# Patient Record
Sex: Female | Born: 1945 | Race: White | Hispanic: No | Marital: Single | Smoking: Current every day smoker
Health system: Southern US, Community
[De-identification: ages and names within clinical notes are randomized; demographics above are authoritative.]

## PROBLEM LIST (undated history)

## (undated) DIAGNOSIS — IMO0002 Reserved for concepts with insufficient information to code with codable children: Secondary | ICD-10-CM

## (undated) DIAGNOSIS — N179 Acute kidney failure, unspecified: Secondary | ICD-10-CM

## (undated) DIAGNOSIS — E872 Acidosis, unspecified: Secondary | ICD-10-CM

## (undated) DIAGNOSIS — E860 Dehydration: Secondary | ICD-10-CM

## (undated) DIAGNOSIS — R51 Headache: Secondary | ICD-10-CM

## (undated) DIAGNOSIS — M79673 Pain in unspecified foot: Secondary | ICD-10-CM

## (undated) DIAGNOSIS — M1990 Unspecified osteoarthritis, unspecified site: Secondary | ICD-10-CM

## (undated) DIAGNOSIS — K759 Inflammatory liver disease, unspecified: Secondary | ICD-10-CM

## (undated) DIAGNOSIS — J449 Chronic obstructive pulmonary disease, unspecified: Secondary | ICD-10-CM

## (undated) HISTORY — DX: Unspecified osteoarthritis, unspecified site: M19.90

## (undated) HISTORY — DX: Pain in unspecified foot: M79.673

## (undated) HISTORY — PX: COLON SURGERY: SHX602

## (undated) HISTORY — DX: Inflammatory liver disease, unspecified: K75.9

## (undated) HISTORY — DX: Reserved for concepts with insufficient information to code with codable children: IMO0002

## (undated) HISTORY — DX: Headache: R51

---

## 1969-07-24 HISTORY — PX: CHOLECYSTECTOMY: SHX55

## 1997-07-24 HISTORY — PX: PARTIAL GASTRECTOMY: SHX2172

## 1997-07-24 HISTORY — PX: PANCREAS SURGERY: SHX731

## 1998-02-15 ENCOUNTER — Other Ambulatory Visit: Admission: RE | Admit: 1998-02-15 | Discharge: 1998-02-15 | Payer: Self-pay

## 2011-04-26 ENCOUNTER — Other Ambulatory Visit: Payer: Self-pay

## 2011-04-26 DIAGNOSIS — I739 Peripheral vascular disease, unspecified: Secondary | ICD-10-CM

## 2011-05-30 ENCOUNTER — Encounter: Payer: Self-pay | Admitting: Vascular Surgery

## 2011-06-06 ENCOUNTER — Encounter: Payer: Self-pay | Admitting: Vascular Surgery

## 2011-06-07 ENCOUNTER — Ambulatory Visit (INDEPENDENT_AMBULATORY_CARE_PROVIDER_SITE_OTHER): Payer: Medicare Other | Admitting: Vascular Surgery

## 2011-06-07 ENCOUNTER — Other Ambulatory Visit (INDEPENDENT_AMBULATORY_CARE_PROVIDER_SITE_OTHER): Payer: Medicare Other | Admitting: *Deleted

## 2011-06-07 ENCOUNTER — Encounter: Payer: Self-pay | Admitting: Vascular Surgery

## 2011-06-07 VITALS — BP 99/63 | HR 67 | Resp 18 | Ht 60.0 in | Wt 99.0 lb

## 2011-06-07 DIAGNOSIS — R0989 Other specified symptoms and signs involving the circulatory and respiratory systems: Secondary | ICD-10-CM

## 2011-06-07 DIAGNOSIS — I70219 Atherosclerosis of native arteries of extremities with intermittent claudication, unspecified extremity: Secondary | ICD-10-CM

## 2011-06-07 DIAGNOSIS — Z48812 Encounter for surgical aftercare following surgery on the circulatory system: Secondary | ICD-10-CM

## 2011-06-07 NOTE — Progress Notes (Signed)
[facility] [place]  [name] MRN: [id number] DOB: 1946/02/05 Sex: female  CC: referred by Dr.Petery to evaluate circulation prior to surgery on toes.  HPI: Vanessa Massey is a 65 y.o. female was referred for evaluation of her circulatory status of both lower extremities. He has a plantar fasciitis is also problems with her toenails. She may require some upcoming surgery on her toes. She denies any history of claudication, rest pain, or nonhealing ulcers. She does admit to night cramps in her calves at times.  Past Medical History  Diagnosis Date  . Heel pain   . Hepatitis   . Arthritis   . Asthma   . Ulcer   . Headache     Family History  Problem Relation Age of Onset  . Diabetes Mother   . Arthritis Father   . Cancer Father     SOCIAL HISTORY: History  Substance Use Topics  . Smoking status: Current Everyday Smoker -- 0.1 packs/day    Types: Cigarettes  . Smokeless tobacco: Not on file   Comment: using Nicoderm patches  . Alcohol Use: No    Allergies  Allergen Reactions  . Morphine And Related Nausea And Vomiting    Current Outpatient Prescriptions  Medication Sig Dispense Refill  . alendronate (FOSAMAX) 70 MG tablet Take 70 mg by mouth every 7 (seven) days. Take with a full glass of water on an empty stomach.       . esomeprazole (NEXIUM) 40 MG capsule Take 40 mg by mouth daily before breakfast.        . Fluticasone-Salmeterol (ADVAIR) 250-50 MCG/DOSE AEPB Inhale 1 puff into the lungs every 12 (twelve) hours.        . meloxicam (MOBIC) 15 MG tablet Take 15 mg by mouth daily.        . mirtazapine (REMERON) 15 MG tablet Take 15 mg by mouth at bedtime.        Marland Kitchen oxycodone (OXY-IR) 5 MG capsule Take 5 mg by mouth every 4 (four) hours as needed.        . traZODone (DESYREL) 100 MG tablet Take 100 mg by mouth at bedtime.          REVIEW OF SYSTEMS: Arly.Keller ] denotes positive finding; [  ] denotes negative finding CARDIOVASCULAR:   [ ]  chest pain   [ ]  chest pressure   [ ]  palpitations   [ ]  orthopnea   [ ]  dyspnea on exertion   [ ]  claudication   [ ]  rest pain   [ ]  DVT   [ ]  phlebitis PULMONARY:   [ ]  productive cough   [ ]  asthma   [ ]  wheezing NEUROLOGIC:   [ ]  weakness  [ ]  paresthesias  [ ]  aphasia  [ ]  amaurosis  [ ]  dizziness HEMATOLOGIC:   [ ]  bleeding problems   [ ]  clotting disorders MUSCULOSKELETAL:  [ ]  joint pain   [ ]  joint swelling [ ]  leg swelling GASTROINTESTINAL: [ ]   blood in stool  [ ]   hematemesis GENITOURINARY:  [ ]   dysuria  [ ]   hematuria PSYCHIATRIC:  [ ]  history of major depression INTEGUMENTARY:  [ ]  rashes  [ ]  ulcers CONSTITUTIONAL:  [ ]  fever   [ ]  chills  PHYSICAL EXAM: Filed Vitals:   06/07/11 1514  BP: 99/63  Pulse: 67  Resp: 18  Height: 5' (1.524 m)  Weight: 99 lb (44.906 kg)  SpO2: 96%  Body mass index is 19.33 kg/(m^2). GENERAL: The patient is a well-nourished female, in no acute distress. The vital signs are documented above. CARDIOVASCULAR: There is a regular rate and rhythm without significant murmur appreciated. Do not detect any carotid bruits. She has palpable femoral and dorsalis pedis pulses bilaterally. She had noticed a palpable left posterior tibial pulse. Both feet are warm and well perfused. She has no significant lower Cherie swelling. PULMONARY: There is good air exchange bilaterally without wheezing or rales. ABDOMEN: Soft and non-tender with normal pitched bowel sounds.  MUSCULOSKELETAL: There are no major deformities or cyanosis. NEUROLOGIC: No focal weakness or paresthesias are detected. SKIN: There are no ulcers or rashes noted. PSYCHIATRIC: The patient has a normal affect.  DATA:  I have independently interpreted her arterial Doppler study which shows an ABI of 83% on the right and 95% on the left. She has a biphasic right posterior tibial and dorsalis pedis signal. She has a biphasic left posterior tibial signal and a monophasic left dorsalis pedis  signal. Pressure of the right is 85 mmHg. Pressure on the left is 80 mmHg.  MEDICAL ISSUES: Based on her exam and Doppler studies I think she has adequate circulation in both feet to heal any surgery that might be required on her toes. Normally toe pressure of greater than or equal to 40 mm of mercury is adequate for healing. She does have a history of smoking and seems very interested in tobacco cessation. I have sent a referral to the [facility] tobacco cessation clinic who will be in contact with her. We'll see her back as needed.  Damary Doland S [facility] [place] Office: [phone]

## 2017-01-03 ENCOUNTER — Encounter (HOSPITAL_COMMUNITY): Payer: Self-pay | Admitting: Emergency Medicine

## 2017-01-03 ENCOUNTER — Emergency Department (HOSPITAL_COMMUNITY): Payer: Medicare Other

## 2017-01-03 ENCOUNTER — Inpatient Hospital Stay (HOSPITAL_COMMUNITY)
Admission: EM | Admit: 2017-01-03 | Discharge: 2017-01-11 | DRG: 871 | Disposition: A | Discharge status: Home / Self Care | Payer: Medicare Other | Attending: Internal Medicine | Admitting: Internal Medicine

## 2017-01-03 DIAGNOSIS — A419 Sepsis, unspecified organism: Secondary | ICD-10-CM | POA: Diagnosis present

## 2017-01-03 DIAGNOSIS — Z7189 Other specified counseling: Secondary | ICD-10-CM | POA: Diagnosis not present

## 2017-01-03 DIAGNOSIS — R195 Other fecal abnormalities: Secondary | ICD-10-CM | POA: Diagnosis not present

## 2017-01-03 DIAGNOSIS — J9601 Acute respiratory failure with hypoxia: Secondary | ICD-10-CM | POA: Diagnosis present

## 2017-01-03 DIAGNOSIS — G8929 Other chronic pain: Secondary | ICD-10-CM | POA: Diagnosis present

## 2017-01-03 DIAGNOSIS — W1830XA Fall on same level, unspecified, initial encounter: Secondary | ICD-10-CM | POA: Diagnosis present

## 2017-01-03 DIAGNOSIS — E872 Acidosis, unspecified: Secondary | ICD-10-CM

## 2017-01-03 DIAGNOSIS — S32591A Other specified fracture of right pubis, initial encounter for closed fracture: Secondary | ICD-10-CM | POA: Diagnosis present

## 2017-01-03 DIAGNOSIS — W19XXXA Unspecified fall, initial encounter: Secondary | ICD-10-CM

## 2017-01-03 DIAGNOSIS — S329XXD Fracture of unspecified parts of lumbosacral spine and pelvis, subsequent encounter for fracture with routine healing: Secondary | ICD-10-CM | POA: Diagnosis not present

## 2017-01-03 DIAGNOSIS — Z79899 Other long term (current) drug therapy: Secondary | ICD-10-CM

## 2017-01-03 DIAGNOSIS — J9600 Acute respiratory failure, unspecified whether with hypoxia or hypercapnia: Secondary | ICD-10-CM | POA: Diagnosis present

## 2017-01-03 DIAGNOSIS — D72829 Elevated white blood cell count, unspecified: Secondary | ICD-10-CM | POA: Diagnosis present

## 2017-01-03 DIAGNOSIS — E875 Hyperkalemia: Secondary | ICD-10-CM

## 2017-01-03 DIAGNOSIS — S3210XA Unspecified fracture of sacrum, initial encounter for closed fracture: Secondary | ICD-10-CM | POA: Diagnosis present

## 2017-01-03 DIAGNOSIS — Z1612 Extended spectrum beta lactamase (ESBL) resistance: Secondary | ICD-10-CM | POA: Diagnosis not present

## 2017-01-03 DIAGNOSIS — E876 Hypokalemia: Secondary | ICD-10-CM | POA: Diagnosis present

## 2017-01-03 DIAGNOSIS — I480 Paroxysmal atrial fibrillation: Secondary | ICD-10-CM | POA: Diagnosis present

## 2017-01-03 DIAGNOSIS — D509 Iron deficiency anemia, unspecified: Secondary | ICD-10-CM | POA: Diagnosis present

## 2017-01-03 DIAGNOSIS — M25552 Pain in left hip: Secondary | ICD-10-CM

## 2017-01-03 DIAGNOSIS — S32501D Unspecified fracture of right pubis, subsequent encounter for fracture with routine healing: Secondary | ICD-10-CM | POA: Diagnosis not present

## 2017-01-03 DIAGNOSIS — K861 Other chronic pancreatitis: Secondary | ICD-10-CM | POA: Diagnosis present

## 2017-01-03 DIAGNOSIS — I482 Chronic atrial fibrillation: Secondary | ICD-10-CM | POA: Diagnosis present

## 2017-01-03 DIAGNOSIS — A4151 Sepsis due to Escherichia coli [E. coli]: Secondary | ICD-10-CM | POA: Diagnosis present

## 2017-01-03 DIAGNOSIS — K759 Inflammatory liver disease, unspecified: Secondary | ICD-10-CM

## 2017-01-03 DIAGNOSIS — I70219 Atherosclerosis of native arteries of extremities with intermittent claudication, unspecified extremity: Secondary | ICD-10-CM | POA: Diagnosis present

## 2017-01-03 DIAGNOSIS — J449 Chronic obstructive pulmonary disease, unspecified: Secondary | ICD-10-CM | POA: Diagnosis present

## 2017-01-03 DIAGNOSIS — E860 Dehydration: Secondary | ICD-10-CM

## 2017-01-03 DIAGNOSIS — Z66 Do not resuscitate: Secondary | ICD-10-CM | POA: Diagnosis present

## 2017-01-03 DIAGNOSIS — B9629 Other Escherichia coli [E. coli] as the cause of diseases classified elsewhere: Secondary | ICD-10-CM | POA: Diagnosis not present

## 2017-01-03 DIAGNOSIS — J42 Unspecified chronic bronchitis: Secondary | ICD-10-CM | POA: Diagnosis not present

## 2017-01-03 DIAGNOSIS — F329 Major depressive disorder, single episode, unspecified: Secondary | ICD-10-CM | POA: Diagnosis present

## 2017-01-03 DIAGNOSIS — R627 Adult failure to thrive: Secondary | ICD-10-CM | POA: Diagnosis not present

## 2017-01-03 DIAGNOSIS — N179 Acute kidney failure, unspecified: Secondary | ICD-10-CM

## 2017-01-03 DIAGNOSIS — S329XXA Fracture of unspecified parts of lumbosacral spine and pelvis, initial encounter for closed fracture: Secondary | ICD-10-CM

## 2017-01-03 DIAGNOSIS — E162 Hypoglycemia, unspecified: Secondary | ICD-10-CM | POA: Diagnosis present

## 2017-01-03 DIAGNOSIS — Z515 Encounter for palliative care: Secondary | ICD-10-CM

## 2017-01-03 DIAGNOSIS — D649 Anemia, unspecified: Secondary | ICD-10-CM | POA: Diagnosis present

## 2017-01-03 DIAGNOSIS — I4891 Unspecified atrial fibrillation: Secondary | ICD-10-CM | POA: Diagnosis not present

## 2017-01-03 DIAGNOSIS — N10 Acute tubulo-interstitial nephritis: Secondary | ICD-10-CM | POA: Diagnosis present

## 2017-01-03 DIAGNOSIS — E43 Unspecified severe protein-calorie malnutrition: Secondary | ICD-10-CM | POA: Diagnosis present

## 2017-01-03 DIAGNOSIS — N390 Urinary tract infection, site not specified: Secondary | ICD-10-CM | POA: Diagnosis not present

## 2017-01-03 DIAGNOSIS — W19XXXS Unspecified fall, sequela: Secondary | ICD-10-CM | POA: Diagnosis not present

## 2017-01-03 DIAGNOSIS — E871 Hypo-osmolality and hyponatremia: Secondary | ICD-10-CM | POA: Diagnosis present

## 2017-01-03 DIAGNOSIS — S3210XD Unspecified fracture of sacrum, subsequent encounter for fracture with routine healing: Secondary | ICD-10-CM | POA: Diagnosis not present

## 2017-01-03 HISTORY — DX: Acidosis, unspecified: E87.20

## 2017-01-03 HISTORY — DX: Dehydration: E86.0

## 2017-01-03 HISTORY — DX: Chronic obstructive pulmonary disease, unspecified: J44.9

## 2017-01-03 HISTORY — DX: Acidosis: E87.2

## 2017-01-03 HISTORY — DX: Acute kidney failure, unspecified: N17.9

## 2017-01-03 LAB — BASIC METABOLIC PANEL
Anion gap: 22 — ABNORMAL HIGH (ref 5–15)
Anion gap: 16 — ABNORMAL HIGH (ref 5–15)
BUN: 47 mg/dL — AB (ref 6–20)
BUN: 45 mg/dL — AB (ref 6–20)
CALCIUM: 7.6 mg/dL — AB (ref 8.9–10.3)
CHLORIDE: 98 mmol/L — AB (ref 101–111)
CO2: 10 mmol/L — ABNORMAL LOW (ref 22–32)
CO2: 12 mmol/L — ABNORMAL LOW (ref 22–32)
CREATININE: 2.14 mg/dL — AB (ref 0.44–1.00)
CREATININE: 1.92 mg/dL — AB (ref 0.44–1.00)
Calcium: 6.8 mg/dL — ABNORMAL LOW (ref 8.9–10.3)
Chloride: 101 mmol/L (ref 101–111)
GFR calc Af Amer: 26 mL/min — ABNORMAL LOW (ref >60)
GFR calc Af Amer: 30 mL/min — ABNORMAL LOW (ref >60)
GFR calc non Af Amer: 22 mL/min — ABNORMAL LOW (ref >60)
GFR, EST NON AFRICAN AMERICAN: 26 mL/min — AB (ref >60)
GLUCOSE: 121 mg/dL — AB (ref 65–99)
Glucose, Bld: <20 mg/dL — CL (ref 65–99)
Potassium: 6.2 mmol/L — ABNORMAL HIGH (ref 3.5–5.1)
Potassium: 5.6 mmol/L — ABNORMAL HIGH (ref 3.5–5.1)
SODIUM: 130 mmol/L — AB (ref 135–145)
SODIUM: 129 mmol/L — AB (ref 135–145)

## 2017-01-03 LAB — I-STAT CHEM 8, ED
BUN: 47 mg/dL — ABNORMAL HIGH (ref 6–20)
CALCIUM ION: 0.88 mmol/L — AB (ref 1.15–1.40)
CHLORIDE: 102 mmol/L (ref 101–111)
Creatinine, Ser: 2.1 mg/dL — ABNORMAL HIGH (ref 0.44–1.00)
GLUCOSE: 35 mg/dL — AB (ref 65–99)
HCT: 29 % — ABNORMAL LOW (ref 36.0–46.0)
Hemoglobin: 9.9 g/dL — ABNORMAL LOW (ref 12.0–15.0)
Potassium: 5.9 mmol/L — ABNORMAL HIGH (ref 3.5–5.1)
SODIUM: 127 mmol/L — AB (ref 135–145)
TCO2: 13 mmol/L (ref 0–100)

## 2017-01-03 LAB — LACTIC ACID, PLASMA: Lactic Acid, Venous: 1.7 mmol/L (ref 0.5–1.9)

## 2017-01-03 LAB — IRON AND TIBC
Iron: 26 ug/dL — ABNORMAL LOW (ref 28–170)
Saturation Ratios: 11 % (ref 10.4–31.8)
TIBC: 246 ug/dL — ABNORMAL LOW (ref 250–450)
UIBC: 220 ug/dL

## 2017-01-03 LAB — RETICULOCYTES
RBC.: 2.87 MIL/uL — ABNORMAL LOW (ref 3.87–5.11)
RETIC COUNT ABSOLUTE: 74.6 10*3/uL (ref 19.0–186.0)
RETIC CT PCT: 2.6 % (ref 0.4–3.1)

## 2017-01-03 LAB — PROTIME-INR
INR: 1.74
Prothrombin Time: 20.5 seconds — ABNORMAL HIGH (ref 11.4–15.2)

## 2017-01-03 LAB — CBC WITH DIFFERENTIAL/PLATELET
Basophils Absolute: 0 10*3/uL (ref 0.0–0.1)
Basophils Relative: 0 %
EOS ABS: 0 10*3/uL (ref 0.0–0.7)
Eosinophils Relative: 0 %
HCT: 26.2 % — ABNORMAL LOW (ref 36.0–46.0)
Hemoglobin: 8.3 g/dL — ABNORMAL LOW (ref 12.0–15.0)
Lymphocytes Relative: 5 %
Lymphs Abs: 1.8 10*3/uL (ref 0.7–4.0)
MCH: 23.8 pg — ABNORMAL LOW (ref 26.0–34.0)
MCHC: 31.7 g/dL (ref 30.0–36.0)
MCV: 75.1 fL — ABNORMAL LOW (ref 78.0–100.0)
MONO ABS: 1.5 10*3/uL — AB (ref 0.1–1.0)
Monocytes Relative: 4 %
NEUTROS PCT: 91 %
Neutro Abs: 33.6 10*3/uL — ABNORMAL HIGH (ref 1.7–7.7)
PLATELETS: 456 10*3/uL — AB (ref 150–400)
RBC: 3.49 MIL/uL — ABNORMAL LOW (ref 3.87–5.11)
RDW: 16.9 % — AB (ref 11.5–15.5)
WBC: 36.9 10*3/uL — AB (ref 4.0–10.5)

## 2017-01-03 LAB — CBG MONITORING, ED
Glucose-Capillary: 34 mg/dL — CL (ref 65–99)
Glucose-Capillary: 139 mg/dL — ABNORMAL HIGH (ref 65–99)

## 2017-01-03 LAB — URINALYSIS, ROUTINE W REFLEX MICROSCOPIC
BILIRUBIN URINE: NEGATIVE
Glucose, UA: NEGATIVE mg/dL
KETONES UR: 5 mg/dL — AB
Nitrite: NEGATIVE
Protein, ur: 30 mg/dL — AB
SPECIFIC GRAVITY, URINE: 1.017 (ref 1.005–1.030)
pH: 5 (ref 5.0–8.0)

## 2017-01-03 LAB — PROCALCITONIN: PROCALCITONIN: 1.26 ng/mL

## 2017-01-03 LAB — GLUCOSE, CAPILLARY: Glucose-Capillary: 112 mg/dL — ABNORMAL HIGH (ref 65–99)

## 2017-01-03 LAB — APTT: APTT: 36 s (ref 24–36)

## 2017-01-03 LAB — FERRITIN: FERRITIN: 125 ng/mL (ref 11–307)

## 2017-01-03 LAB — OSMOLALITY: Osmolality: 292 mOsm/kg (ref 275–295)

## 2017-01-03 LAB — VITAMIN B12: Vitamin B-12: 1366 pg/mL — ABNORMAL HIGH (ref 180–914)

## 2017-01-03 LAB — I-STAT CG4 LACTIC ACID, ED: Lactic Acid, Venous: 2.23 mmol/L (ref 0.5–1.9)

## 2017-01-03 LAB — CK: CK TOTAL: 135 U/L (ref 38–234)

## 2017-01-03 LAB — FOLATE: Folate: 18.1 ng/mL (ref >5.9)

## 2017-01-03 MED ORDER — VENLAFAXINE HCL ER 37.5 MG PO CP24
37.5000 mg | ORAL_CAPSULE | Freq: Every day | ORAL | Status: DC
  Administered 2017-01-04: 37.5 mg via ORAL
  Filled 2017-01-03: qty 1

## 2017-01-03 MED ORDER — ONDANSETRON HCL 4 MG/2ML IJ SOLN
4.0000 mg | Freq: Once | INTRAMUSCULAR | Status: AC
Start: 2017-01-03 — End: 2017-01-03
  Administered 2017-01-03: 4 mg via INTRAVENOUS
  Filled 2017-01-03: qty 2

## 2017-01-03 MED ORDER — HEPARIN SODIUM (PORCINE) 5000 UNIT/ML IJ SOLN
5000.0000 [IU] | Freq: Three times a day (TID) | INTRAMUSCULAR | Status: DC
  Administered 2017-01-03 – 2017-01-04 (×3): 5000 [IU] via SUBCUTANEOUS
  Filled 2017-01-03 (×3): qty 1

## 2017-01-03 MED ORDER — ALBUTEROL SULFATE (2.5 MG/3ML) 0.083% IN NEBU: 3.0000 mL | INHALATION_SOLUTION | Freq: Four times a day (QID) | RESPIRATORY_TRACT | Status: DC | PRN

## 2017-01-03 MED ORDER — ONDANSETRON HCL 4 MG PO TABS: 8.0000 mg | ORAL_TABLET | Freq: Three times a day (TID) | ORAL | Status: DC | PRN

## 2017-01-03 MED ORDER — SODIUM POLYSTYRENE SULFONATE 15 GM/60ML PO SUSP
15.0000 g | Freq: Once | ORAL | Status: AC
  Administered 2017-01-03: 15 g via ORAL
  Filled 2017-01-03: qty 60

## 2017-01-03 MED ORDER — ONDANSETRON HCL 4 MG/2ML IJ SOLN: 4.0000 mg | Freq: Four times a day (QID) | INTRAMUSCULAR | Status: DC | PRN

## 2017-01-03 MED ORDER — SODIUM CHLORIDE 0.9 % IV SOLN: 500.0000 mg | INTRAVENOUS | Status: DC

## 2017-01-03 MED ORDER — LINACLOTIDE 145 MCG PO CAPS
145.0000 ug | ORAL_CAPSULE | Freq: Every day | ORAL | Status: DC
  Administered 2017-01-04: 145 ug via ORAL
  Filled 2017-01-03: qty 1

## 2017-01-03 MED ORDER — MOMETASONE FURO-FORMOTEROL FUM 200-5 MCG/ACT IN AERO
2.0000 | INHALATION_SPRAY | Freq: Two times a day (BID) | RESPIRATORY_TRACT | Status: DC
  Filled 2017-01-03: qty 8.8

## 2017-01-03 MED ORDER — CEFTRIAXONE SODIUM 1 G IJ SOLR
1.0000 g | Freq: Once | INTRAMUSCULAR | Status: AC
  Administered 2017-01-03: 1 g via INTRAVENOUS
  Filled 2017-01-03: qty 10

## 2017-01-03 MED ORDER — DEXTROSE 50 % IV SOLN
1.0000 | Freq: Once | INTRAVENOUS | Status: AC
  Administered 2017-01-03: 50 mL via INTRAVENOUS
  Filled 2017-01-03: qty 50

## 2017-01-03 MED ORDER — SODIUM CHLORIDE 0.9% FLUSH: 10.0000 mL | INTRAVENOUS | Status: DC | PRN

## 2017-01-03 MED ORDER — OXYCODONE HCL 5 MG PO TABS
20.0000 mg | ORAL_TABLET | ORAL | Status: DC | PRN
  Administered 2017-01-03 – 2017-01-04 (×2): 20 mg via ORAL
  Filled 2017-01-03 (×4): qty 4

## 2017-01-03 MED ORDER — PANCRELIPASE (LIP-PROT-AMYL) 12000-38000 UNITS PO CPEP
24000.0000 [IU] | ORAL_CAPSULE | Freq: Three times a day (TID) | ORAL | Status: DC
  Administered 2017-01-03 – 2017-01-04 (×3): 24000 [IU] via ORAL
  Filled 2017-01-03 (×3): qty 2

## 2017-01-03 MED ORDER — ALBUTEROL SULFATE (2.5 MG/3ML) 0.083% IN NEBU
2.5000 mg | INHALATION_SOLUTION | Freq: Four times a day (QID) | RESPIRATORY_TRACT | Status: DC
  Filled 2017-01-03 (×2): qty 3

## 2017-01-03 MED ORDER — BOOST / RESOURCE BREEZE PO LIQD
1.0000 | Freq: Three times a day (TID) | ORAL | Status: DC
  Administered 2017-01-03 – 2017-01-04 (×2): 1 via ORAL

## 2017-01-03 MED ORDER — ACETAMINOPHEN 325 MG PO TABS
650.0000 mg | ORAL_TABLET | Freq: Once | ORAL | Status: AC
  Administered 2017-01-03: 650 mg via ORAL
  Filled 2017-01-03: qty 2

## 2017-01-03 MED ORDER — MIRTAZAPINE 15 MG PO TABS
15.0000 mg | ORAL_TABLET | Freq: Every day | ORAL | Status: DC
  Administered 2017-01-03: 15 mg via ORAL
  Filled 2017-01-03: qty 1

## 2017-01-03 MED ORDER — SODIUM CHLORIDE 0.9 % IV BOLUS (SEPSIS)
1000.0000 mL | Freq: Once | INTRAVENOUS | Status: AC
  Administered 2017-01-03: 1000 mL via INTRAVENOUS

## 2017-01-03 MED ORDER — PIPERACILLIN-TAZOBACTAM 3.375 G IVPB 30 MIN: 3.3750 g | Freq: Once | INTRAVENOUS | Status: DC

## 2017-01-03 MED ORDER — VANCOMYCIN HCL IN DEXTROSE 750-5 MG/150ML-% IV SOLN
750.0000 mg | Freq: Once | INTRAVENOUS | Status: DC
  Filled 2017-01-03: qty 150

## 2017-01-03 MED ORDER — ACETAMINOPHEN 650 MG RE SUPP: 650.0000 mg | Freq: Four times a day (QID) | RECTAL | Status: DC | PRN

## 2017-01-03 MED ORDER — ONDANSETRON HCL 4 MG PO TABS: 4.0000 mg | ORAL_TABLET | Freq: Four times a day (QID) | ORAL | Status: DC | PRN

## 2017-01-03 MED ORDER — DEXTROSE 5 % IV SOLN
1.0000 g | INTRAVENOUS | Status: DC
  Filled 2017-01-03: qty 10

## 2017-01-03 MED ORDER — ACETAMINOPHEN 325 MG PO TABS: 650.0000 mg | ORAL_TABLET | Freq: Four times a day (QID) | ORAL | Status: DC | PRN

## 2017-01-03 MED ORDER — PANTOPRAZOLE SODIUM 40 MG PO TBEC
40.0000 mg | DELAYED_RELEASE_TABLET | Freq: Every day | ORAL | Status: DC
  Administered 2017-01-04: 40 mg via ORAL
  Filled 2017-01-03: qty 1

## 2017-01-03 MED ORDER — VANCOMYCIN HCL IN DEXTROSE 1-5 GM/200ML-% IV SOLN: 1000.0000 mg | Freq: Once | INTRAVENOUS | Status: DC

## 2017-01-03 MED ORDER — SODIUM CHLORIDE 0.9 % IV SOLN
INTRAVENOUS | Status: DC
  Administered 2017-01-03 – 2017-01-04 (×3): via INTRAVENOUS

## 2017-01-03 NOTE — ED Notes (Signed)
[facility] and antibiotic. Informed that antibiotic had not been initiated d/t blood cultures have not been drawn. [facility]link reports that if patient is a difficult stick (as this patient is very difficult) that the antibiotic may be given prior to blood cultures drawn. Will initiate antibiotic promptly.

## 2017-01-03 NOTE — Progress Notes (Signed)
Pharmacy Antibiotic Note  Vanessa MakiJudy F Massey is a 71 y.o. female admitted on 01/03/2017 with sepsis.  Pharmacy has been consulted for vancomycin and zosyn dosing. WBC elevated at 37, afebrile and LA elevated at 2.23. SCr 2.1 for estimated CrCl ~ 10-15 mL/min. Low total body weight noted.   Plan: Vancomycin 750mg  IV x1, then 500 mg IV q48hr  Ceftriaxone 1g IV q24hr per MD  Vancomycin trough at SS and as needed (goal trough 15-20 mcg/mL) Monitor renal function, clinical picture, and culture data F/u length of therapy   Height: 4\' 11"  (149.9 cm) IBW/kg (Calculated) : 43.2  Temp (24hrs), Avg:97.9 F (36.6 C), Min:97.9 F (36.6 C), Max:97.9 F (36.6 C)   Recent Labs Lab 01/03/17 1053 01/03/17 1058 01/03/17 1300 01/03/17 1351  WBC  --   --  36.9*  --   CREATININE 2.14* 2.10*  --   --   LATICACIDVEN  --   --   --  2.23*    CrCl cannot be calculated (Unknown ideal weight.).    Allergies  Allergen Reactions  . Bee Pollen Shortness Of Breath  . Morphine And Related Nausea And Vomiting  . Varenicline Other (See Comments)    Nightmares, Nausea,depressive thoughts  . Codeine Nausea Only  . Neomycin Rash    Antimicrobials this admission: 6/13 Vanc >>  6/13 CTX>>  Dose adjustments this admission: n/a  Microbiology results: pending   York CeriseKatherine Cook, PharmD Pharmacy Resident  Pager [phone] 01/03/17 3:28 PM

## 2017-01-03 NOTE — Progress Notes (Signed)
Notified Schorr, NP pt refusing for blood cultures to be drawn and explained to the pt the need to get blood cultures pt continued to refused. Will continue to monitor pt. Nelda MarseilleJenny Thacker, RN

## 2017-01-03 NOTE — ED Notes (Signed)
Patient reported that she needed to urinate. Patient placed on bedpan. Patient unable to urinate in bedpan. Will in and out cath patient for urine specimen.

## 2017-01-03 NOTE — ED Provider Notes (Signed)
[facility]-EMERGENCY DEPT Provider Note   CSN: [id number] Arrival date & time: 01/03/17  1007     History   Chief Complaint Chief Complaint  Patient presents with  . Fall    HPI Vanessa Massey is a 71 y.o. female.  The history is provided by the patient and medical records.  Fall      71 year old female with history of arthritis, asthma, headaches, history of hepatitis, history of DDD, presenting to the ED for continued follow back and left leg pain. Patient reports she fell on Monday. States she was using her walker but her left leg "gave out" on her.  She fell onto her left side on the concrete surface. She denies any dizziness or feelings of syncope. States on Monday [facility] but did not have any imaging studies performed. States she was told she had "muscle spasms". States she is artery on home oxycodone but has not had any relief. She's not had any further falls or trauma. She reports pain in her left lower back and left posterior thigh, states it feels like cramps. She denies any numbness or weakness. She continues using walker to ambulate, does have worsening pain with weightbearing.  No bowel or bladder incontinence.    Past Medical History:  Diagnosis Date  . Arthritis   . Asthma   . Headache   . Heel pain   . Hepatitis   . Ulcer     Patient Active Problem List   Diagnosis Date Noted  . Atherosclerosis of native arteries of the extremities with intermittent claudication 06/07/2011    Past Surgical History:  Procedure Laterality Date  . CHOLECYSTECTOMY  1971  . COLON SURGERY    . PANCREAS SURGERY  1999  . PARTIAL GASTRECTOMY  1999    OB History    No data available       Home Medications    Prior to Admission medications   Medication Sig Start Date End Date Taking? Authorizing Provider  alendronate (FOSAMAX) 70 MG tablet Take 70 mg by mouth every 7 (seven) days. Take with a full glass of water on an empty stomach.     [provider]  esomeprazole (NEXIUM) 40 MG capsule Take 40 mg by mouth daily before breakfast.      [provider]  Fluticasone-Salmeterol (ADVAIR) 250-50 MCG/DOSE AEPB Inhale 1 puff into the lungs every 12 (twelve) hours.      [provider]  meloxicam (MOBIC) 15 MG tablet Take 15 mg by mouth daily.      [provider]  mirtazapine (REMERON) 15 MG tablet Take 15 mg by mouth at bedtime.      [provider]  oxycodone (OXY-IR) 5 MG capsule Take 5 mg by mouth every 4 (four) hours as needed.      [provider]  traZODone (DESYREL) 100 MG tablet Take 100 mg by mouth at bedtime.      [provider]    Family History Family History  Problem Relation Age of Onset  . Diabetes Mother   . Arthritis Father   . Cancer Father     Social History Social History  Substance Use Topics  . Smoking status: Current Every Day Smoker    Packs/day: 0.10    Types: Cigarettes  . Smokeless tobacco: Not on file     Comment: using Nicoderm patches  . Alcohol use No     Allergies   Morphine and related  Review of Systems Review of Systems  Musculoskeletal: Positive for arthralgias and back pain.  All other systems reviewed and are negative.    Physical Exam Updated Vital Signs BP 136/65 (BP Location: Left Arm)   Pulse 95   Temp 97.9 F (36.6 C) (Oral)   Resp 12   SpO2 98%   Physical Exam  Constitutional: She is oriented to person, place, and time. She appears well-developed and well-nourished.  Very thin in appearance, appears weak and clinically dry  HENT:  Head: Normocephalic and atraumatic.  Mouth/Throat: Oropharynx is clear and moist.  Eyes: Conjunctivae and EOM are normal. Pupils are equal, round, and reactive to light.  Neck: Normal range of motion.  Cardiovascular: Normal rate, regular rhythm and normal heart sounds.   Pulmonary/Chest: Effort normal and breath sounds normal.  Abdominal: Soft. Bowel sounds are normal.    Musculoskeletal: Normal range of motion.  Abrasions noted to left lumbar paraspinal region; no bruising or gross deformities; tenderness throughout low back on exam including SI joint Left hip with some tenderness along posterior aspect, reports it radiates into the groin with movement; abrasions noted to left posterior thigh  Neurological: She is alert and oriented to person, place, and time.  Skin: Skin is warm and dry.  Psychiatric: She has a normal mood and affect.  Nursing note and vitals reviewed.    ED Treatments / Results  Labs (all labs ordered are listed, but only abnormal results are displayed) Labs Reviewed  BASIC METABOLIC PANEL - Abnormal; Notable for the following:       Result Value   Sodium 130 (*)    Potassium 6.2 (*)    Chloride 98 (*)    CO2 10 (*)    Glucose, Bld <20 (*)    BUN 47 (*)    Creatinine, Ser 2.14 (*)    Calcium 7.6 (*)    GFR calc non Af Amer 22 (*)    GFR calc Af Amer 26 (*)    Anion gap 22 (*)    All other components within normal limits  CBC WITH DIFFERENTIAL/PLATELET - Abnormal; Notable for the following:    WBC 36.9 (*)    RBC 3.49 (*)    Hemoglobin 8.3 (*)    HCT 26.2 (*)    MCV 75.1 (*)    MCH 23.8 (*)    RDW 16.9 (*)    Platelets 456 (*)    Neutro Abs 33.6 (*)    Monocytes Absolute 1.5 (*)    All other components within normal limits  I-STAT CHEM 8, ED - Abnormal; Notable for the following:    Sodium 127 (*)    Potassium 5.9 (*)    BUN 47 (*)    Creatinine, Ser 2.10 (*)    Glucose, Bld 35 (*)    Calcium, Ion 0.88 (*)    Hemoglobin 9.9 (*)    HCT 29.0 (*)    All other components within normal limits  I-STAT CG4 LACTIC ACID, ED - Abnormal; Notable for the following:    Lactic Acid, Venous 2.23 (*)    All other components within normal limits  CBG MONITORING, ED - Abnormal; Notable for the following:    Glucose-Capillary 34 (*)    All other components within normal limits  URINE CULTURE  CULTURE, BLOOD (ROUTINE X 2)   CULTURE, BLOOD (ROUTINE X 2)  CK  CBC WITH DIFFERENTIAL/PLATELET  URINALYSIS, ROUTINE W REFLEX MICROSCOPIC    EKG  EKG Interpretation  Date/Time:  Wednesday January 03 2017 14:20:04 EDT Ventricular Rate:  95 PR Interval:    QRS Duration: 97 QT Interval:  372 QTC Calculation: 468 R Axis:   73 Text Interpretation:  Sinus rhythm Low voltage, extremity leads Baseline wander in lead(s) V6 no stemi Confirmed by Drema Pry [phone]) on 01/03/2017 3:12:31 PM       Radiology Dg Lumbar Spine Complete  Result Date: 01/03/2017 CLINICAL DATA:  Fall Monday with continued lower back pain. Initial encounter. EXAM: LUMBAR SPINE - COMPLETE 4+ VIEW COMPARISON:  None. FINDINGS: Mild superior endplate irregularity at L4, chronic appearing and with accentuated spurring. No acute fracture suspected. Mild disc narrowing at L3-4. L4-5 and L5-S1 mild facet spurring. Osteopenia. Nonspecific L1-2 disc calcification. Extensive atherosclerotic calcification. Linear, scar-like opacity seen over the anterior lung. These results were called by telephone at the time of interpretation on 01/03/2017 at 11:57 am to Dr. Sharilyn Sites , who verbally acknowledged these results. IMPRESSION: 1. Findings of emphysematous cystitis. 2. No acute finding in the lumbar spine. Electronically Signed   By: Marnee Spring M.D.   On: 01/03/2017 11:58   Dg Pelvis 1-2 Views  Result Date: 01/03/2017 CLINICAL DATA:  Less post fall 2 days ago with persistent left groin and lateral femur pain. Also tingling in the left foot. EXAM: PELVIS - 1-2 VIEW COMPARISON:  None in PACs FINDINGS: The bones are subjectively osteopenic. No acute pelvic fracture is observed. There is no lytic or blastic pelvic lesion. The hip joint spaces are reasonably well maintained. The articular surfaces of the femoral heads and acetabuli remains smoothly rounded. IMPRESSION: No acute fracture of the pelvis is observed. Electronically Signed   By: David  [place] M.D.   On:  01/03/2017 11:56   Dg Chest Port 1 View  Result Date: 01/03/2017 CLINICAL DATA:  Fall.  Elevated white count. EXAM: PORTABLE CHEST 1 VIEW COMPARISON:  None. FINDINGS: The cardiomediastinal silhouette is unremarkable. Minimal mid right lung subsegmental scar/atelectasis or minor fissure pleural thickening is noted. There is no evidence of focal airspace disease, pulmonary edema, suspicious pulmonary nodule/mass, pleural effusion, or pneumothorax. No acute bony abnormalities are identified. IMPRESSION: No evidence of acute abnormality. Electronically Signed   By: Harmon Pier M.D.   On: 01/03/2017 14:06   Dg Femur Min 2 Views Left  Result Date: 01/03/2017 CLINICAL DATA:  Status post fall.  Left hip pain. EXAM: LEFT FEMUR 2 VIEWS COMPARISON:  None. FINDINGS: There is no evidence of fracture or other focal bone lesions. Soft tissues are unremarkable. Peripheral vascular atherosclerotic disease. IMPRESSION: No acute osseous injury of the left femur. Given the patient's age and osteopenia, if there is persistent clinical concern for an occult hip fracture, a MRI of the hip is recommended for increased sensitivity. Electronically Signed   By: Elige Ko   On: 01/03/2017 11:55    Procedures Procedures (including critical care time)  Medications Ordered in ED Medications  sodium chloride flush (NS) 0.9 % injection 10-40 mL (not administered)  0.9 %  sodium chloride infusion ( Intravenous New Bag/Given 01/03/17 1347)  cefTRIAXone (ROCEPHIN) 1 g in dextrose 5 % 50 mL IVPB (0 g Intravenous Hold 01/03/17 1454)  acetaminophen (TYLENOL) tablet 650 mg (650 mg Oral Given 01/03/17 1045)  sodium chloride 0.9 % bolus 1,000 mL (1,000 mLs Intravenous New Bag/Given 01/03/17 1442)  dextrose 50 % solution 50 mL (50 mLs Intravenous Given 01/03/17 1410)  sodium polystyrene (KAYEXALATE) 15 GM/60ML suspension 15 g (15 g Oral Given 01/03/17 1442)  ondansetron (  ZOFRAN) injection 4 mg (4 mg Intravenous Given 01/03/17 1454)      Initial Impression / Assessment and Plan / ED Course  I have reviewed the triage vital signs and the nursing notes.  Pertinent labs & imaging results that were available during my care of the patient were reviewed by me and considered in my medical decision making (see chart for details).  71 year old female here initially for fall. Seen for the same on Monday at outside hospital but did not have any imaging or laboratory testing performed. Cam here due to continued pain in her left leg and back.  Here she is thin and seems weak and clinically dry on exam. She has no acute bony deformities, remains ambulatory with assistance which is her baseline.  Screening labs were obtained revealing acute kidney injury, therefore workup expanded revealing significant leukocytosis of 36.9, confirming acute kidney injury with likely subsequent hyperkalemia.  No significant EKG changes. Patient has had some hypoglycemia here but remains AAO.  She was therefore given Kayexalate as opposed to insulin for treatment of her hyperkalemia. Lactate is elevated at 2.2.  Imaging studies were negative for acute findings, however there was findings concerning for emphysematous cystitis on her lumbar spine films so I suspect this is her source of infection. CXR clear.    Patient does admit to decreased urination at home which I suspect is likely secondary to dehydration. UA here is pending as well as blood and urine cultures. Patient was given IV fluids. She was started on Rocephin. Tolerating fluids well here.  Will admit to hospitalist service for ongoing care.  Final Clinical Impressions(s) / ED Diagnoses   Final diagnoses:  Fall  AKI (acute kidney injury) (HCC)  Hyperkalemia    New Prescriptions New Prescriptions   No medications on file     Garlon Hatchet, PA-C 01/03/17 1518

## 2017-01-03 NOTE — ED Notes (Signed)
Pt given some water per Bonnie(RN)

## 2017-01-03 NOTE — H&P (Signed)
History and Physical    Vanessa Massey ZOX:[id number] DOB: 25-Apr-1946 DOA: 01/03/2017  PCP: Lindwood Qua, MD Patient coming from: home  Chief Complaint: fall  HPI: Vanessa Massey is a 71 y.o. female with medical history significant for arthritis, asthma, degenerative disc disease hepatitis presents to the emergency department Chief complaint generalized weakness persistent low back pain and left leg pain. Initial evaluation reveals a lactic acidosis, acute kidney injury, hyperkalemia and leukocytosis and hypoglycemia.  Information is obtained from the chart and the patient. She states that 2 days ago she suffered a mechanical fall. She denies having any difficulty getting up off the ground. She states since that time she has been "hurting all over". She also has chronic pain chronic low back pain in her home medications include oxycodone and Naprosyn. She states she uses a walker at home and has not had any recent falls. She denies headache dizziness syncope or near-syncope. She denies fever chills chest pain palpitations cough lower extremity edema. She does endorse some nausea without vomiting. She denies dysuria hematuria frequency or urgency. She does endorse decreased oral intake over the last 2 days.     ED Course: In emergency department she's afebrile hemodynamically stable heart rate 96 respiratory rate 24. Oxygen saturation level 86% in ED. She received IV fluids antibiotics and 1 amp of D50  Review of Systems: As per HPI otherwise all other systems reviewed and are negative.   Ambulatory Status: She ambulates with a walker. No recent falls prior to this admission he lives alone continues to drive  Past Medical History:  Diagnosis Date  . Acidosis   . Acute kidney injury (HCC)   . Acute kidney injury (HCC)   . Arthritis   . Asthma   . COPD (chronic obstructive pulmonary disease) (HCC) 01/03/2017  . Dehydration   . Headache(784.0)   . Heel pain   . Hepatitis   . Ulcer       Past Surgical History:  Procedure Laterality Date  . CHOLECYSTECTOMY  1971  . COLON SURGERY    . PANCREAS SURGERY  1999  . PARTIAL GASTRECTOMY  1999    Social History   Social History  . Marital status: Single    Spouse name: N/A  . Number of children: N/A  . Years of education: N/A   Occupational History  . Not on file.   Social History Main Topics  . Smoking status: Current Every Day Smoker    Packs/day: 0.10    Types: Cigarettes  . Smokeless tobacco: Never Used     Comment: using Nicoderm patches  . Alcohol use No  . Drug use: No  . Sexual activity: Not on file   Other Topics Concern  . Not on file   Social History Narrative  . No narrative on file  She lives at home alone. She continues to drive. Uses a walker for ambulation.  Allergies  Allergen Reactions  . Bee Pollen Shortness Of Breath  . Morphine And Related Nausea And Vomiting  . Varenicline Other (See Comments)    Nightmares, Nausea,depressive thoughts  . Codeine Nausea Only  . Neomycin Rash    Family History  Problem Relation Age of Onset  . Diabetes Mother   . Arthritis Father   . Cancer Father     Prior to Admission medications   Medication Sig Start Date End Date Taking? Authorizing Provider  albuterol (PROVENTIL HFA;VENTOLIN HFA) 108 (90 Base) MCG/ACT inhaler Inhale 1 puff into the lungs every  6 (six) hours as needed for wheezing or shortness of breath.   Yes [provider]  budesonide-formoterol (SYMBICORT) 160-4.5 MCG/ACT inhaler Inhale 2 puffs into the lungs 2 (two) times daily.   Yes [provider]  linaclotide (LINZESS) 145 MCG CAPS capsule Take 145 mcg by mouth daily before breakfast.   Yes [provider]  loperamide (IMODIUM) 2 MG capsule Take 2 mg by mouth as needed for diarrhea or loose stools.   Yes [provider]  magnesium oxide (MAG-OX) 400 MG tablet Take 400 mg by mouth 2 (two) times daily.   Yes [provider]   mirtazapine (REMERON) 15 MG tablet Take 15 mg by mouth at bedtime.     Yes [provider]  naproxen (NAPROSYN) 500 MG tablet Take 500 mg by mouth 2 (two) times daily as needed for mild pain.   Yes [provider]  omeprazole (PRILOSEC) 20 MG capsule Take 20 mg by mouth 2 (two) times daily before a meal.   Yes [provider]  ondansetron (ZOFRAN) 4 MG tablet Take 8 mg by mouth every 8 (eight) hours as needed for nausea or vomiting.   Yes [provider]  Oxycodone HCl 20 MG TABS Take 20 mg by mouth every 4 (four) hours as needed (pain).   Yes [provider]  Pancrelipase, Lip-Prot-Amyl, (CREON) 24000-76000 units CPEP Take 1 capsule by mouth 3 (three) times daily.   Yes [provider]  Potassium 99 MG TABS Take 1 tablet by mouth daily.   Yes [provider]  spironolactone (ALDACTONE) 25 MG tablet Take 25 mg by mouth daily.   Yes [provider]  venlafaxine XR (EFFEXOR-XR) 37.5 MG 24 hr capsule Take 37.5 mg by mouth daily with breakfast.   Yes [provider]    Physical Exam: Vitals:   01/03/17 1445 01/03/17 1500 01/03/17 1515 01/03/17 1600  BP: (!) 152/62  (!) 147/54 (!) 137/56  Pulse: 96  (!) 119 89  Resp: 16  (!) 24 16  Temp:      TempSrc:      SpO2: 98%  100% 98%  Weight:  36.3 kg (80 lb)    Height:         General:  Appears Quite frail thin chronically ill but not uncomfortable Eyes:  PERRL, EOMI, normal lids, iris ENT:  grossly normal hearing, lips & tongue, mucous membranes of her mouth are pink somewhat dry Neck:  no LAD, masses or thyromegaly Cardiovascular:  RRR, no m/r/g. No LE edema.  Respiratory:  CTA bilaterally, no w/r/r. Normal respiratory effort. Abdomen:  soft, ntnd, positive bowel sounds no guarding or rebounding Skin:  no rash or induration seen on limited exam, no abrasions noted to the left leg Musculoskeletal:  grossly normal tone BUE/BLE, good ROM, no bony  abnormality Psychiatric:  grossly normal mood and affect, speech fluent and appropriate, AOx3 Neurologic:  CN 2-12 grossly intact, moves all extremities in coordinated fashion, sensation intact speech clear facial symmetry follows commands  Labs on Admission: I have personally reviewed following labs and imaging studies  CBC:  Recent Labs Lab 01/03/17 1058 01/03/17 1300  WBC  --  36.9*  NEUTROABS  --  33.6*  HGB 9.9* 8.3*  HCT 29.0* 26.2*  MCV  --  75.1*  PLT  --  456*   Basic Metabolic Panel:  Recent Labs Lab 01/03/17 1053 01/03/17 1058  NA 130* 127*  K 6.2* 5.9*  CL 98* 102  CO2 10*  --  GLUCOSE <20* 35*  BUN 47* 47*  CREATININE 2.14* 2.10*  CALCIUM 7.6*  --    GFR: Estimated Creatinine Clearance: 14.5 mL/min (A) (by C-G formula based on SCr of 2.1 mg/dL (H)). Liver Function Tests: No results for input(s): AST, ALT, ALKPHOS, BILITOT, PROT, ALBUMIN in the last 168 hours. No results for input(s): LIPASE, AMYLASE in the last 168 hours. No results for input(s): AMMONIA in the last 168 hours. Coagulation Profile: No results for input(s): INR, PROTIME in the last 168 hours. Cardiac Enzymes:  Recent Labs Lab 01/03/17 1053  CKTOTAL 135   BNP (last 3 results) No results for input(s): PROBNP in the last 8760 hours. HbA1C: No results for input(s): HGBA1C in the last 72 hours. CBG:  Recent Labs Lab 01/03/17 1406 01/03/17 1537  GLUCAP 34* 139*   Lipid Profile: No results for input(s): CHOL, HDL, LDLCALC, TRIG, CHOLHDL, LDLDIRECT in the last 72 hours. Thyroid Function Tests: No results for input(s): TSH, T4TOTAL, FREET4, T3FREE, THYROIDAB in the last 72 hours. Anemia Panel: No results for input(s): VITAMINB12, FOLATE, FERRITIN, TIBC, IRON, RETICCTPCT in the last 72 hours. Urine analysis: No results found for: COLORURINE, APPEARANCEUR, LABSPEC, PHURINE, GLUCOSEU, HGBUR, BILIRUBINUR, KETONESUR, PROTEINUR, UROBILINOGEN, NITRITE, LEUKOCYTESUR  Creatinine  Clearance: Estimated Creatinine Clearance: 14.5 mL/min (A) (by C-G formula based on SCr of 2.1 mg/dL (H)).  Sepsis Labs: @LABRCNTIP (procalcitonin:4,lacticidven:4) )No results found for this or any previous visit (from the past 240 hour(s)).   Radiological Exams on Admission: Dg Lumbar Spine Complete  Result Date: 01/03/2017 CLINICAL DATA:  Fall Monday with continued lower back pain. Initial encounter. EXAM: LUMBAR SPINE - COMPLETE 4+ VIEW COMPARISON:  None. FINDINGS: Mild superior endplate irregularity at L4, chronic appearing and with accentuated spurring. No acute fracture suspected. Mild disc narrowing at L3-4. L4-5 and L5-S1 mild facet spurring. Osteopenia. Nonspecific L1-2 disc calcification. Extensive atherosclerotic calcification. Linear, scar-like opacity seen over the anterior lung. These results were called by telephone at the time of interpretation on 01/03/2017 at 11:57 am to Dr. Sharilyn SitesLISA SANDERS , who verbally acknowledged these results. IMPRESSION: 1. Findings of emphysematous cystitis. 2. No acute finding in the lumbar spine. Electronically Signed   By: Marnee SpringJonathon  Watts M.D.   On: 01/03/2017 11:58   Dg Pelvis 1-2 Views  Result Date: 01/03/2017 CLINICAL DATA:  Less post fall 2 days ago with persistent left groin and lateral femur pain. Also tingling in the left foot. EXAM: PELVIS - 1-2 VIEW COMPARISON:  None in PACs FINDINGS: The bones are subjectively osteopenic. No acute pelvic fracture is observed. There is no lytic or blastic pelvic lesion. The hip joint spaces are reasonably well maintained. The articular surfaces of the femoral heads and acetabuli remains smoothly rounded. IMPRESSION: No acute fracture of the pelvis is observed. Electronically Signed   By: David  SwazilandJordan M.D.   On: 01/03/2017 11:56   Dg Chest Port 1 View  Result Date: 01/03/2017 CLINICAL DATA:  Fall.  Elevated white count. EXAM: PORTABLE CHEST 1 VIEW COMPARISON:  None. FINDINGS: The cardiomediastinal silhouette is  unremarkable. Minimal mid right lung subsegmental scar/atelectasis or minor fissure pleural thickening is noted. There is no evidence of focal airspace disease, pulmonary edema, suspicious pulmonary nodule/mass, pleural effusion, or pneumothorax. No acute bony abnormalities are identified. IMPRESSION: No evidence of acute abnormality. Electronically Signed   By: Harmon PierJeffrey  Hu M.D.   On: 01/03/2017 14:06   Dg Femur Min 2 Views Left  Result Date: 01/03/2017 CLINICAL DATA:  Status post fall.  Left hip pain.  EXAM: LEFT FEMUR 2 VIEWS COMPARISON:  None. FINDINGS: There is no evidence of fracture or other focal bone lesions. Soft tissues are unremarkable. Peripheral vascular atherosclerotic disease. IMPRESSION: No acute osseous injury of the left femur. Given the patient's age and osteopenia, if there is persistent clinical concern for an occult hip fracture, a MRI of the hip is recommended for increased sensitivity. Electronically Signed   By: Elige Ko   On: 01/03/2017 11:55    EKG: Independently reviewed. Sinus rhythm Low voltage, extremity leads Baseline wander in lead(s) V6 no stemi  Assessment/Plan Principal Problem:   Acute respiratory failure (HCC) Active Problems:   Atherosclerosis of native artery of extremity with intermittent claudication (HCC)   Acidosis   Acute kidney injury (HCC)   Dehydration   Hyperkalemia   Sepsis (HCC)   Anemia   Leukocytosis   Hypoglycemia   Hyponatremia   COPD (chronic obstructive pulmonary disease) (HCC)   #1. Acute respiratory failure with hypoxia. Etiology uncertain. She does have history of COPD but does not require continuous oxygen. Denies shortness of breath cough. Some concern for overmedication and/or sepsis. She does have a leukocytosis and elevated lactic acid level. Chest x-ray no acute abnormality. She had a hypoxic episode where oxygen saturation level was 86% on room air. -Admit to telemetry -Continue oxygen saturation  monitoring -Nebulizers -Follow blood cultures -Vigorous IV fluids -Provide antibiotics -wean oxygen as able -track lactic acid  #2. Sepsis. Likely urinary source. She has a leukocytosis and elevated lactic acid acute kidney injury and hypoglycemia. No fever hemodynamically stable Chest x-ray as noted above. -IV fluids -Antibiotics per protocol -Follow urinalysis -Track lactic acid -Follow blood cultures -monitor cbg -Monitor intake and output  #3. Acute kidney injury. Likely related to dehydration the setting of decreased oral intake after fall. Creatinine 2.1. Chart review indicates creatinine within the limits of normal 3 months ago. -IV fluids -Hold nephrotoxins -Monitor urine output -Recheck in the morning -If no improvement consider a renal ultrasound  #4. Hyperkalemia. Related to above in the setting of supplements. Potassium 5.9. Home meds include spironolactone. She's provided with Kayexalate in the emergency department -hold home supplements -IV fluids -recheck this evening -repeat kayexelate this evening  5. Anemia. History of same. Hemoglobin 8.3. Chart review indicates baseline is 9.5-10.5. No signs symptoms of active bleeding. -FOBT -Anemia panel  #6. Hypoglycemia. Likely related to above. Serum glucose less than 20 on presentation. She received amp of D50. On admission CBG 139 -IV fluids -CBG every 4 hours  #7.hyponatremia. Sodium level 127. Likely related to above. Home medications include effexor  -IV fluids -serum osmolality -urine sodium and osmolality  #8. COPD. Patient smoked for 47 years she quit 6 months ago. Home medications include inhalers. Not on home oxygen -See #1 -Nebulizers -Resume home meds as indicated     DVT prophylaxis: heparin  Code Status: full  Family Communication: none present  Disposition Plan: home may need snf  Consults called: physical therapy, nutrition  Admission status: inpatient    Gwenyth Bender MD [facility]  If 7PM-7AM, please contact night-coverage [email or web address] Password [facility]  01/03/2017, 4:08 PM

## 2017-01-03 NOTE — ED Notes (Signed)
IV team staff members at bedside at this time.

## 2017-01-03 NOTE — ED Triage Notes (Addendum)
Patient arrived to ED from home (lives alone) via [facility]. [facility] reports: Patient fell on Monday. [facility]. Patient continues to experience lower back pain and L leg pain from fall.  Hx COPD, DDD. BP 130/58, Pulse 90, 96% on room air.

## 2017-01-03 NOTE — ED Notes (Signed)
Called to request phlebotomy to draw blood cultures prior to patient being taken upstairs to inpatient room. Blood cultures will need to be drawn prior to antibiotic initiated.

## 2017-01-03 NOTE — ED Notes (Signed)
Pt CBG was 139, notified Bonnie(RN)

## 2017-01-03 NOTE — ED Provider Notes (Signed)
Medical screening examination/treatment/procedure(s) were conducted as a shared visit with non-physician practitioner(s) and myself.  I personally evaluated the patient during the encounter. Briefly, the patient is a 71 y.o. female who presented for leg pains following a mechanical fall that occurred 2 days ago. Imaging negative. Workup revealed significant leukocytosis with elevated lactic acid. No source of infection the patient was started on enteric antibiotics. Labs also revealed new renal failure with hyperkalemia for which she was given IV fluids and Kayexalate. No EKG changes.  Admitted to medicine for further workup and management   EKG Interpretation  Date/Time:  Wednesday January 03 2017 14:20:04 EDT Ventricular Rate:  95 PR Interval:    QRS Duration: 97 QT Interval:  372 QTC Calculation: 468 R Axis:   73 Text Interpretation:  Sinus rhythm Low voltage, extremity leads Baseline wander in lead(s) V6 no stemi Confirmed by Drema Pryardama, Vidhi Delellis [phone]) on 01/03/2017 3:12:31 PM           Asiel Chrostowski, Amadeo GarnetPedro Eduardo, MD 01/03/17 1513

## 2017-01-04 DIAGNOSIS — E872 Acidosis: Secondary | ICD-10-CM

## 2017-01-04 DIAGNOSIS — I4891 Unspecified atrial fibrillation: Secondary | ICD-10-CM

## 2017-01-04 DIAGNOSIS — E162 Hypoglycemia, unspecified: Secondary | ICD-10-CM

## 2017-01-04 DIAGNOSIS — E43 Unspecified severe protein-calorie malnutrition: Secondary | ICD-10-CM | POA: Insufficient documentation

## 2017-01-04 DIAGNOSIS — D509 Iron deficiency anemia, unspecified: Secondary | ICD-10-CM

## 2017-01-04 LAB — CBC
HEMATOCRIT: 20.2 % — AB (ref 36.0–46.0)
HEMOGLOBIN: 6.6 g/dL — AB (ref 12.0–15.0)
MCH: 24 pg — AB (ref 26.0–34.0)
MCHC: 32.7 g/dL (ref 30.0–36.0)
MCV: 73.5 fL — ABNORMAL LOW (ref 78.0–100.0)
Platelets: 376 10*3/uL (ref 150–400)
RBC: 2.75 MIL/uL — AB (ref 3.87–5.11)
RDW: 16.7 % — ABNORMAL HIGH (ref 11.5–15.5)
WBC: 36.3 10*3/uL — ABNORMAL HIGH (ref 4.0–10.5)

## 2017-01-04 LAB — HEPATIC FUNCTION PANEL
ALBUMIN: 1.5 g/dL — AB (ref 3.5–5.0)
ALK PHOS: 342 U/L — AB (ref 38–126)
ALT: 75 U/L — ABNORMAL HIGH (ref 14–54)
AST: 176 U/L — AB (ref 15–41)
Bilirubin, Direct: 0.5 mg/dL (ref 0.1–0.5)
Indirect Bilirubin: 1.5 mg/dL — ABNORMAL HIGH (ref 0.3–0.9)
Total Bilirubin: 2 mg/dL — ABNORMAL HIGH (ref 0.3–1.2)
Total Protein: 4.5 g/dL — ABNORMAL LOW (ref 6.5–8.1)

## 2017-01-04 LAB — BASIC METABOLIC PANEL
Anion gap: 12 (ref 5–15)
BUN: 42 mg/dL — ABNORMAL HIGH (ref 6–20)
CHLORIDE: 106 mmol/L (ref 101–111)
CO2: 15 mmol/L — ABNORMAL LOW (ref 22–32)
Calcium: 6.7 mg/dL — ABNORMAL LOW (ref 8.9–10.3)
Creatinine, Ser: 1.66 mg/dL — ABNORMAL HIGH (ref 0.44–1.00)
GFR calc non Af Amer: 30 mL/min — ABNORMAL LOW (ref >60)
GFR, EST AFRICAN AMERICAN: 35 mL/min — AB (ref >60)
Glucose, Bld: 106 mg/dL — ABNORMAL HIGH (ref 65–99)
POTASSIUM: 4.6 mmol/L (ref 3.5–5.1)
SODIUM: 133 mmol/L — AB (ref 135–145)

## 2017-01-04 LAB — GLUCOSE, CAPILLARY
GLUCOSE-CAPILLARY: 100 mg/dL — AB (ref 65–99)
GLUCOSE-CAPILLARY: 109 mg/dL — AB (ref 65–99)
GLUCOSE-CAPILLARY: 112 mg/dL — AB (ref 65–99)
GLUCOSE-CAPILLARY: 88 mg/dL (ref 65–99)
Glucose-Capillary: 111 mg/dL — ABNORMAL HIGH (ref 65–99)
Glucose-Capillary: 91 mg/dL (ref 65–99)

## 2017-01-04 LAB — HIV ANTIBODY (ROUTINE TESTING W REFLEX): HIV Screen 4th Generation wRfx: NONREACTIVE

## 2017-01-04 LAB — OSMOLALITY, URINE: Osmolality, Ur: 395 mOsm/kg (ref 300–900)

## 2017-01-04 LAB — PREPARE RBC (CROSSMATCH)

## 2017-01-04 LAB — PREALBUMIN

## 2017-01-04 LAB — TSH: TSH: 0.598 u[IU]/mL (ref 0.350–4.500)

## 2017-01-04 LAB — ABO/RH: ABO/RH(D): O POS

## 2017-01-04 LAB — SODIUM, URINE, RANDOM: Sodium, Ur: 22 mmol/L

## 2017-01-04 MED ORDER — MIRTAZAPINE 15 MG PO TABS
15.0000 mg | ORAL_TABLET | Freq: Every day | ORAL | Status: DC
  Administered 2017-01-04 – 2017-01-07 (×4): 15 mg via ORAL
  Filled 2017-01-04 (×4): qty 1

## 2017-01-04 MED ORDER — METOPROLOL TARTRATE 12.5 MG HALF TABLET
12.5000 mg | ORAL_TABLET | Freq: Two times a day (BID) | ORAL | Status: DC
  Administered 2017-01-04: 12.5 mg via ORAL
  Filled 2017-01-04: qty 1

## 2017-01-04 MED ORDER — ONDANSETRON HCL 4 MG PO TABS: 4.0000 mg | ORAL_TABLET | Freq: Four times a day (QID) | ORAL | Status: DC | PRN

## 2017-01-04 MED ORDER — LINACLOTIDE 145 MCG PO CAPS
145.0000 ug | ORAL_CAPSULE | Freq: Every day | ORAL | Status: DC
  Administered 2017-01-05 – 2017-01-07 (×3): 145 ug via ORAL
  Filled 2017-01-04 (×7): qty 1

## 2017-01-04 MED ORDER — MOMETASONE FURO-FORMOTEROL FUM 200-5 MCG/ACT IN AERO
2.0000 | INHALATION_SPRAY | Freq: Two times a day (BID) | RESPIRATORY_TRACT | Status: DC
  Administered 2017-01-04 – 2017-01-11 (×13): 2 via RESPIRATORY_TRACT
  Filled 2017-01-04: qty 8.8

## 2017-01-04 MED ORDER — METOPROLOL TARTRATE 25 MG PO TABS: 25.0000 mg | ORAL_TABLET | Freq: Two times a day (BID) | ORAL | Status: DC

## 2017-01-04 MED ORDER — ADULT MULTIVITAMIN W/MINERALS CH
1.0000 | ORAL_TABLET | Freq: Every day | ORAL | Status: DC
  Administered 2017-01-05 – 2017-01-11 (×7): 1 via ORAL
  Filled 2017-01-04 (×6): qty 1

## 2017-01-04 MED ORDER — PANCRELIPASE (LIP-PROT-AMYL) 12000-38000 UNITS PO CPEP
24000.0000 [IU] | ORAL_CAPSULE | Freq: Three times a day (TID) | ORAL | Status: DC
  Administered 2017-01-04 – 2017-01-08 (×12): 24000 [IU] via ORAL
  Filled 2017-01-04 (×12): qty 2

## 2017-01-04 MED ORDER — ENSURE ENLIVE PO LIQD
237.0000 mL | Freq: Three times a day (TID) | ORAL | Status: DC
  Administered 2017-01-07 – 2017-01-10 (×7): 237 mL via ORAL

## 2017-01-04 MED ORDER — METOPROLOL TARTRATE 5 MG/5ML IV SOLN
2.5000 mg | Freq: Four times a day (QID) | INTRAVENOUS | Status: DC | PRN
  Administered 2017-01-04 (×2): 2.5 mg via INTRAVENOUS
  Filled 2017-01-04 (×3): qty 5

## 2017-01-04 MED ORDER — ACETAMINOPHEN 325 MG PO TABS
650.0000 mg | ORAL_TABLET | Freq: Four times a day (QID) | ORAL | Status: DC | PRN
  Administered 2017-01-08 (×3): 650 mg via ORAL
  Filled 2017-01-04 (×3): qty 2

## 2017-01-04 MED ORDER — SODIUM CHLORIDE 0.9 % IV SOLN
Freq: Once | INTRAVENOUS | Status: AC
  Administered 2017-01-04: 07:00:00 via INTRAVENOUS

## 2017-01-04 MED ORDER — ONDANSETRON HCL 4 MG/2ML IJ SOLN
4.0000 mg | Freq: Four times a day (QID) | INTRAMUSCULAR | Status: DC | PRN
  Administered 2017-01-10: 4 mg via INTRAVENOUS
  Filled 2017-01-04: qty 2

## 2017-01-04 MED ORDER — METOPROLOL TARTRATE 5 MG/5ML IV SOLN: 2.5000 mg | Freq: Four times a day (QID) | INTRAVENOUS | Status: DC | PRN

## 2017-01-04 MED ORDER — DILTIAZEM HCL 100 MG IV SOLR: 5.0000 mg/h | INTRAVENOUS | Status: DC

## 2017-01-04 MED ORDER — SODIUM CHLORIDE 0.9 % IV SOLN
INTRAVENOUS | Status: DC
  Administered 2017-01-04 – 2017-01-05 (×2): via INTRAVENOUS

## 2017-01-04 MED ORDER — OXYCODONE HCL 5 MG PO TABS
20.0000 mg | ORAL_TABLET | ORAL | Status: DC | PRN
  Administered 2017-01-04 – 2017-01-07 (×12): 20 mg via ORAL
  Filled 2017-01-04 (×12): qty 4

## 2017-01-04 MED ORDER — DILTIAZEM LOAD VIA INFUSION
10.0000 mg | Freq: Once | INTRAVENOUS | Status: DC
  Filled 2017-01-04: qty 10

## 2017-01-04 MED ORDER — PANTOPRAZOLE SODIUM 40 MG PO TBEC
40.0000 mg | DELAYED_RELEASE_TABLET | Freq: Every day | ORAL | Status: DC
  Administered 2017-01-05 – 2017-01-11 (×7): 40 mg via ORAL
  Filled 2017-01-04 (×7): qty 1

## 2017-01-04 MED ORDER — METOPROLOL TARTRATE 25 MG PO TABS
25.0000 mg | ORAL_TABLET | Freq: Two times a day (BID) | ORAL | Status: DC
  Administered 2017-01-04 – 2017-01-10 (×10): 25 mg via ORAL
  Filled 2017-01-04 (×15): qty 1

## 2017-01-04 MED ORDER — SODIUM CHLORIDE 0.9% FLUSH
10.0000 mL | INTRAVENOUS | Status: DC | PRN
  Administered 2017-01-05 – 2017-01-10 (×3): 10 mL
  Filled 2017-01-04 (×3): qty 40

## 2017-01-04 MED ORDER — ENSURE ENLIVE PO LIQD: 237.0000 mL | Freq: Three times a day (TID) | ORAL | Status: DC

## 2017-01-04 MED ORDER — ACETAMINOPHEN 650 MG RE SUPP: 650.0000 mg | Freq: Four times a day (QID) | RECTAL | Status: DC | PRN

## 2017-01-04 MED ORDER — ADULT MULTIVITAMIN W/MINERALS CH: 1.0000 | ORAL_TABLET | Freq: Every day | ORAL | Status: DC

## 2017-01-04 MED ORDER — HEPARIN SODIUM (PORCINE) 5000 UNIT/ML IJ SOLN
5000.0000 [IU] | Freq: Three times a day (TID) | INTRAMUSCULAR | Status: DC
  Administered 2017-01-04 – 2017-01-11 (×20): 5000 [IU] via SUBCUTANEOUS
  Filled 2017-01-04 (×20): qty 1

## 2017-01-04 MED ORDER — DEXTROSE 5 % IV SOLN
1.0000 g | INTRAVENOUS | Status: DC
  Administered 2017-01-04: 1 g via INTRAVENOUS
  Filled 2017-01-04 (×2): qty 10

## 2017-01-04 MED ORDER — VENLAFAXINE HCL ER 37.5 MG PO CP24
37.5000 mg | ORAL_CAPSULE | Freq: Every day | ORAL | Status: DC
  Administered 2017-01-05 – 2017-01-11 (×7): 37.5 mg via ORAL
  Filled 2017-01-04 (×7): qty 1

## 2017-01-04 MED ORDER — SODIUM CHLORIDE 0.9 % IV SOLN: INTRAVENOUS | Status: DC

## 2017-01-04 MED ORDER — ALBUTEROL SULFATE (2.5 MG/3ML) 0.083% IN NEBU
3.0000 mL | INHALATION_SOLUTION | Freq: Four times a day (QID) | RESPIRATORY_TRACT | Status: DC | PRN
Start: 2017-01-04 — End: 2017-01-11

## 2017-01-04 NOTE — Progress Notes (Signed)
CRITICAL VALUE ALERT  Critical Value:  Hbg 6.6  Date & Time Notied:  01/04/17 [id number]  Provider Notified: Schorr, NP  Orders Received/Actions taken: transfuse 1 unit of PRBCs

## 2017-01-04 NOTE — Progress Notes (Signed)
PT Cancellation Note  Patient Details Name: Vanessa Massey MRN: [id number] DOB: [birth year]   Cancelled Treatment:    Reason Eval/Treat Not Completed: Medical issues which prohibited therapy. HGB os 6.6. Rn requesting PT hold.    Colin BroachSabra M. Martin Belling PT, DPT  [phone]  01/04/2017, 12:55 PM

## 2017-01-04 NOTE — Progress Notes (Signed)
Progress note addendum  Patient actually reverted to sinus rhythm with metoprolol alone. Cardizem drip and stepdown transfer orders were put in error, now canceled. Continue management on telemetry floor. Discussed with patient's RN and pharmacy.  Marcellus ScottHONGALGI,Dereon Corkery, MD, FACP, FHM. [facility] Pager [phone]  If 7PM-7AM, please contact night-coverage [email or web address] Password [email or web address] 01/04/2017, 3:32 PM

## 2017-01-04 NOTE — Progress Notes (Signed)
Initial Nutrition Assessment  DOCUMENTATION CODES:   Underweight, Severe malnutrition in context of chronic illness  INTERVENTION:   -D/c Boost Breeze po TID, each supplement provides 250 kcal and 9 grams of protein -Ensure Enlive po TID, each supplement provides 350 kcal and 20 grams of protein  NUTRITION DIAGNOSIS:   Malnutrition (Severe) related to chronic illness (COPD) as evidenced by severe depletion of body fat, moderate depletion of body fat, moderate depletions of muscle mass, severe depletion of muscle mass.  GOAL:   Patient will meet greater than or equal to 90% of their needs  MONITOR:   PO intake, Supplement acceptance, Diet advancement, Labs, Weight trends, Skin, I & O's  REASON FOR ASSESSMENT:   Consult Assessment of nutrition requirement/status  ASSESSMENT:   Vanessa Massey is a 71 y.o. female who presents with  Sepsis from UTI/pyelo. Brief hypoxic episode likely from being ill and resting in bed w/ poor positioning in the setting of COPD.    Pt admitted with acute respiratory failure.   Pt very somnolent at time of visit. Pt did greet RD, however, did not participate in interview. She also refused to answer her phone, which was ringing during visit. She did agree to exam.   Reviewed clear liquid breakfast tray which was unattempted. Also noted unopened Boost Breeze supplement at bedside.   Reviewed wt hx from [facility]. Noted pt has expeirenced a 13% wt loss over the past year. While this is not significant, it is concerning given underweight status and malnourished state.   Nutrition-Focused physical exam completed. Findings are moderate to severe fat depletion, moderate to severe muscle depletion, and no edema.   Labs reviewed: 88-109.   Diet Order:  Diet regular Room service appropriate? Yes; Fluid consistency: Thin  Skin:  Reviewed, no issues  Last BM:  01/02/17  Height:   Ht Readings from Last 1 Encounters:  01/03/17 4\' 11"  (1.499 m)     Weight:   Wt Readings from Last 1 Encounters:  01/04/17 80 lb (36.3 kg)    Ideal Body Weight:  44.5 kg  BMI:  Body mass index is 16.16 kg/m.  Estimated Nutritional Needs:   Kcal:  1200-1400  Protein:  65-80 grams  Fluid:  1.2-1.4 L  EDUCATION NEEDS:   Education needs addressed  Gera Inboden A. Mayford KnifeWilliams, RD, LDN, CDE Pager: [phone] After hours Pager: [phone]

## 2017-01-04 NOTE — Progress Notes (Signed)
1020- Pt's HR was in the 140's when checking pre-blood VS. MD Hongalgi made aware. MD ordered EKG 12-lead and telemetry monitoring.  After EKG 12-lead MD ordered metoprolol 2.5 mg IV PRN and metoprolol PO. Both medications were given.  1230- Pt's HR stayed in the 80's for an hour and 25 minutes and is now back in the 130's. MD Hongalgi ordered another 2.5 of IV metoprolol. Medication given. Will continue to assess.

## 2017-01-04 NOTE — Progress Notes (Addendum)
PROGRESS NOTE   Vanessa Massey  ZOX:[id number]    DOB: 1946/01/30    DOA: 01/03/2017  PCP: Lindwood Qua, MD   I have briefly reviewed patients previous medical records in [facility].  Brief Narrative:  71 year old female, lives alone, ambulates with the help of a walker, PMH of asthma/COPD not on home oxygen, told to have hepatitis at age 68, status post partial gastrectomy and pancreas surgery, depression, chronic low back pain, sustained a mechanical fall 2 days prior to admission when she was ambulating to the bathroom without her walker, denies any injuries, bleeding or LOC, able to get up by herself, presents with worsening low back pain, left hip pain. She had been seen at [facility] but no imaging studies performed. Home oxycodone did not provide relief. Also reported weakness but no dizziness or lightheadedness. No dysuria, urinary frequency, fever or chills. 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 Admitted for further management. New onset A. fib with RVR on 6/14.  Assessment & Plan:   Principal Problem:   Acute respiratory failure (HCC) Active Problems:   Atherosclerosis of native artery of extremity with intermittent claudication (HCC)   Acidosis   Acute kidney injury (HCC)   Dehydration   Hyperkalemia   Sepsis (HCC)   Anemia   Leukocytosis   Hypoglycemia   Hyponatremia   COPD (chronic obstructive pulmonary disease) (HCC)   Fall   AKI (acute kidney injury) (HCC)   Acute pyelonephritis   1. Escherichia coli UTI: Patient denies urinary symptoms but urine microscopy and emphysematous cystitis seen on lumbar spine x-ray are concerning. Continue IV ceftriaxone pending urine culture results. I do not believe that she was truly septic on admission. 2. A. fib with RVR, new onset: Noted on 6/14. Asymptomatic.  When necessary IV metoprolol 2.5-5 milligrams every 6 hours. Start metoprolol 25 MG by mouth twice a day and titrate up as tolerated. Check 2-D echo and TSH. Patient reverted to sinus rhythm. 3. Transient hypoxia/acute respiratory failure with hypoxia: She was 86% on room air in the ED. May have been related to acute illness complicating COPD. No clinical bronchospasm. Chest x-ray without acute findings. Resolved. 4. COPD: When necessary broncho-dilator nebulizations. No clinical bronchospasm. 5. Acute kidney injury: Probably due to dehydration. Presented with creatinine of 2.14. This is improved to 1.66. Continue IV fluids and follow BMP. CK normal. Creatinine normal/0.7 on 08/30/16. 6. Hyperkalemia: Resolved. Received Kayexalate in ED. Aldactone on hold. 7. Non-anion gap metabolic acidosis: Unclear etiology. No GI losses reported.? RTA 4-? Etiology. Continue IV fluids and follow BMP. She had bicarbonate of 10 on admission. 8. Dehydration with hyponatremia: Possibly related to poor oral intake. Continue additional 24 hours of IV antibiotics. 9. Chronic microcytic anemia: Presented with hemoglobin of 8.3 which dropped to 6.6 after hydration. Anemia panel reviewed,? Iron deficiency versus chronic disease. Transfusing 1 unit of PRBC. Follow CBCs. Denies history of GI bleed symptoms. Check FOBT. States that she had a colonoscopy 12 years ago and reportedly normal. Baseline hemoglobin in the 8-9 range. 10. Hypoglycemia: Probably due to poor oral intake. Serum glucose on presentation <20. Monitor CBGs closely. Resolved. 11. Elevated INR/abnormal LFTs/hepatitis history:? Cirrhosis. Check acute hepatitis panel and HIV status. 12. Leukocytosis:? Related to acute infectious etiology versus others. Review of labs in care everywhere shows that she has chronic leukocytosis at least since 2015 ranging between tens  and 40, etiology of this is unclear. Check peripheral smear. Trend CBCs for improvement and may need to  consider outpatient hematology consultation. 13. Severe protein calorie malnutrition: Dietitian consultation. 14. Low back and left hip pain: Imaging negative for fractures. PT evaluation. Pain management. 15. Adult failure to thrive: Probably multifactorial related to age and multiple severe comorbidities. 16. Depression: Continue venlafaxine. No suicidal or homicidal ideations reported.   DVT prophylaxis: Subcutaneous heparin Code Status: Full Family Communication: None at bedside Disposition: To be determined   Consultants:  None   Procedures:  LUE PICC line.   Antimicrobials:  IV ceftriaxone    Subjective: Seen this morning. Reports low back pain and left hip pain. Denies dizziness, lightheadedness, LOC.   ROS: Denies headache, earache, sore throat, nausea, vomiting, abdominal pain. Reports constipation. Denies melena or blood in stools. States last colonoscopy was approximately 12 years ago. Denies dysuria or urinary frequency. No chest pain, cough, dyspnea or palpitations. No other bleeding reported.  Objective:  Vitals:   01/04/17 0000 01/04/17 0210 01/04/17 0507 01/04/17 1021  BP:   (!) 153/56 137/71  Pulse:   92 (!) 132  Resp:   20 20  Temp:   98.4 F (36.9 C) 97.6 F (36.4 C)  TempSrc:   Oral Oral  SpO2:   95% 96%  Weight: 36.3 kg (80 lb) 36.3 kg (80 lb)    Height:        Examination:  General exam: Pleasant middle-aged female, small built, frail and cachectic, chronically ill looking, lying comfortably supine in bed. Respiratory system: Clear to auscultation. Respiratory effort normal. Cardiovascular system: S1 & S2 heard, irregularly irregular and tachycardic. No JVD, murmurs, rubs, gallops or clicks. No pedal edema. EKG: A. fib with RVR in the 130s. No acute changes. Gastrointestinal system: Abdomen is nondistended, soft and nontender. No organomegaly or masses felt. Normal bowel sounds heard. Central nervous system: Alert and oriented. No focal  neurological deficits. Extremities: Symmetric 5 x 5 power. No bruising over left hip or lower back. Skin: No rashes, lesions or ulcers Psychiatry: Judgement and insight appear normal. Mood & affect flat.    Data Reviewed: I have personally reviewed following labs and imaging studies  CBC:  Recent Labs Lab 01/03/17 1058 01/03/17 1300 01/04/17 0510  WBC  --  36.9* 36.3*  NEUTROABS  --  33.6*  --   HGB 9.9* 8.3* 6.6*  HCT 29.0* 26.2* 20.2*  MCV  --  75.1* 73.5*  PLT  --  456* 376   Basic Metabolic Panel:  Recent Labs Lab 01/03/17 1053 01/03/17 1058 01/03/17 1857 01/04/17 0359  NA 130* 127* 129* 133*  K 6.2* 5.9* 5.6* 4.6  CL 98* 102 101 106  CO2 10*  --  12* 15*  GLUCOSE <20* 35* 121* 106*  BUN 47* 47* 45* 42*  CREATININE 2.14* 2.10* 1.92* 1.66*  CALCIUM 7.6*  --  6.8* 6.7*   Liver Function Tests:  Recent Labs Lab 01/04/17 0805  AST 176*  ALT 75*  ALKPHOS 342*  BILITOT 2.0*  PROT 4.5*  ALBUMIN 1.5*   Coagulation Profile:  Recent Labs Lab 01/03/17 1730  INR 1.74   Cardiac Enzymes:  Recent Labs Lab 01/03/17 1053  CKTOTAL 135   HbA1C: No results for input(s): HGBA1C in the last 72 hours. CBG:  Recent Labs Lab 01/03/17 1537 01/03/17 2017 01/04/17 0040 01/04/17 0359 01/04/17 0758  GLUCAP 139* 112* 112* 100* 111*    Recent Results (from the past 240 hour(s))  Urine culture     Status: Abnormal (Preliminary result)   Collection Time: 01/03/17  3:45 PM  Result Value Ref Range Status   Specimen Description URINE, CATHETERIZED  Final   Special Requests NONE  Final   Culture (A)  Final    >=100,000 COLONIES/mL ESCHERICHIA COLI SUSCEPTIBILITIES TO FOLLOW    Report Status PENDING  Incomplete         Radiology Studies: Dg Lumbar Spine Complete  Result Date: 01/03/2017 CLINICAL DATA:  Fall Monday with continued lower back pain. Initial encounter. EXAM: LUMBAR SPINE - COMPLETE 4+ VIEW COMPARISON:  None. FINDINGS: Mild superior endplate  irregularity at L4, chronic appearing and with accentuated spurring. No acute fracture suspected. Mild disc narrowing at L3-4. L4-5 and L5-S1 mild facet spurring. Osteopenia. Nonspecific L1-2 disc calcification. Extensive atherosclerotic calcification. Linear, scar-like opacity seen over the anterior lung. These results were called by telephone at the time of interpretation on 01/03/2017 at 11:57 am to Dr. Sharilyn Sites , who verbally acknowledged these results. IMPRESSION: 1. Findings of emphysematous cystitis. 2. No acute finding in the lumbar spine. Electronically Signed   By: Marnee Spring M.D.   On: 01/03/2017 11:58   Dg Pelvis 1-2 Views  Result Date: 01/03/2017 CLINICAL DATA:  Less post fall 2 days ago with persistent left groin and lateral femur pain. Also tingling in the left foot. EXAM: PELVIS - 1-2 VIEW COMPARISON:  None in PACs FINDINGS: The bones are subjectively osteopenic. No acute pelvic fracture is observed. There is no lytic or blastic pelvic lesion. The hip joint spaces are reasonably well maintained. The articular surfaces of the femoral heads and acetabuli remains smoothly rounded. IMPRESSION: No acute fracture of the pelvis is observed. Electronically Signed   By: David  [place] M.D.   On: 01/03/2017 11:56   Dg Chest Port 1 View  Result Date: 01/03/2017 CLINICAL DATA:  Fall.  Elevated white count. EXAM: PORTABLE CHEST 1 VIEW COMPARISON:  None. FINDINGS: The cardiomediastinal silhouette is unremarkable. Minimal mid right lung subsegmental scar/atelectasis or minor fissure pleural thickening is noted. There is no evidence of focal airspace disease, pulmonary edema, suspicious pulmonary nodule/mass, pleural effusion, or pneumothorax. No acute bony abnormalities are identified. IMPRESSION: No evidence of acute abnormality. Electronically Signed   By: Harmon Pier M.D.   On: 01/03/2017 14:06   Dg Femur Min 2 Views Left  Result Date: 01/03/2017 CLINICAL DATA:  Status post fall.  Left hip  pain. EXAM: LEFT FEMUR 2 VIEWS COMPARISON:  None. FINDINGS: There is no evidence of fracture or other focal bone lesions. Soft tissues are unremarkable. Peripheral vascular atherosclerotic disease. IMPRESSION: No acute osseous injury of the left femur. Given the patient's age and osteopenia, if there is persistent clinical concern for an occult hip fracture, a MRI of the hip is recommended for increased sensitivity. Electronically Signed   By: Elige Ko   On: 01/03/2017 11:55        Scheduled Meds: . albuterol  2.5 mg Nebulization Q6H  . feeding supplement  1 Container Oral TID BM  . heparin  5,000 Units Subcutaneous Q8H  . linaclotide  145 mcg Oral QAC breakfast  . lipase/protease/amylase  24,000 Units Oral TID  . metoprolol tartrate  12.5 mg Oral BID  . mirtazapine  15 mg Oral QHS  . mometasone-formoterol  2 puff Inhalation BID  . pantoprazole  40 mg Oral Daily  . venlafaxine XR  37.5 mg Oral Q breakfast   Continuous Infusions: . sodium chloride 125 mL/hr  at 01/04/17 0612  . sodium chloride    . cefTRIAXone (ROCEPHIN)  IV       LOS: 1 day     Elianis Fischbach, MD, FACP, FHM. [facility] Pager [phone]  If 7PM-7AM, please contact night-coverage [email or web address] Password [email or web address] 01/04/2017, 11:11 AM

## 2017-01-05 DIAGNOSIS — N390 Urinary tract infection, site not specified: Secondary | ICD-10-CM

## 2017-01-05 DIAGNOSIS — B9629 Other Escherichia coli [E. coli] as the cause of diseases classified elsewhere: Secondary | ICD-10-CM

## 2017-01-05 DIAGNOSIS — Z1612 Extended spectrum beta lactamase (ESBL) resistance: Secondary | ICD-10-CM

## 2017-01-05 LAB — CBC WITH DIFFERENTIAL/PLATELET
Basophils Absolute: 0 10*3/uL (ref 0.0–0.1)
Basophils Relative: 0 %
EOS PCT: 0 %
Eosinophils Absolute: 0.1 10*3/uL (ref 0.0–0.7)
HCT: 24.8 % — ABNORMAL LOW (ref 36.0–46.0)
Hemoglobin: 8.3 g/dL — ABNORMAL LOW (ref 12.0–15.0)
LYMPHS PCT: 11 %
Lymphs Abs: 2.4 10*3/uL (ref 0.7–4.0)
MCH: 24.8 pg — AB (ref 26.0–34.0)
MCHC: 33.5 g/dL (ref 30.0–36.0)
MCV: 74 fL — AB (ref 78.0–100.0)
MONO ABS: 1.1 10*3/uL — AB (ref 0.1–1.0)
Monocytes Relative: 5 %
Neutro Abs: 19 10*3/uL — ABNORMAL HIGH (ref 1.7–7.7)
Neutrophils Relative %: 84 %
PLATELETS: 309 10*3/uL (ref 150–400)
RBC: 3.35 MIL/uL — ABNORMAL LOW (ref 3.87–5.11)
RDW: 17.4 % — ABNORMAL HIGH (ref 11.5–15.5)
WBC: 22.5 10*3/uL — ABNORMAL HIGH (ref 4.0–10.5)

## 2017-01-05 LAB — BASIC METABOLIC PANEL
Anion gap: 10 (ref 5–15)
Anion gap: 9 (ref 5–15)
BUN: 26 mg/dL — AB (ref 6–20)
BUN: 22 mg/dL — AB (ref 6–20)
CALCIUM: 7.4 mg/dL — AB (ref 8.9–10.3)
CHLORIDE: 109 mmol/L (ref 101–111)
CO2: 15 mmol/L — ABNORMAL LOW (ref 22–32)
CO2: 16 mmol/L — ABNORMAL LOW (ref 22–32)
Calcium: 7.3 mg/dL — ABNORMAL LOW (ref 8.9–10.3)
Chloride: 110 mmol/L (ref 101–111)
Creatinine, Ser: 1.09 mg/dL — ABNORMAL HIGH (ref 0.44–1.00)
Creatinine, Ser: 1.01 mg/dL — ABNORMAL HIGH (ref 0.44–1.00)
GFR calc Af Amer: 59 mL/min — ABNORMAL LOW (ref >60)
GFR calc Af Amer: >60 mL/min (ref >60)
GFR, EST NON AFRICAN AMERICAN: 51 mL/min — AB (ref >60)
GFR, EST NON AFRICAN AMERICAN: 55 mL/min — AB (ref >60)
GLUCOSE: 93 mg/dL (ref 65–99)
GLUCOSE: 85 mg/dL (ref 65–99)
POTASSIUM: 3.4 mmol/L — AB (ref 3.5–5.1)
Potassium: 5.1 mmol/L (ref 3.5–5.1)
Sodium: 134 mmol/L — ABNORMAL LOW (ref 135–145)
Sodium: 135 mmol/L (ref 135–145)

## 2017-01-05 LAB — TYPE AND SCREEN
ABO/RH(D): O POS
Antibody Screen: NEGATIVE
Unit division: 0

## 2017-01-05 LAB — BPAM RBC
Blood Product Expiration Date: 201806292359
ISSUE DATE / TIME: 201806141135
Unit Type and Rh: 5100

## 2017-01-05 LAB — GLUCOSE, CAPILLARY
Glucose-Capillary: 76 mg/dL (ref 65–99)
Glucose-Capillary: 82 mg/dL (ref 65–99)
Glucose-Capillary: 90 mg/dL (ref 65–99)

## 2017-01-05 LAB — HEPATITIS PANEL, ACUTE
HEP B S AG: NEGATIVE
Hep A IgM: NEGATIVE
Hep B C IgM: NEGATIVE

## 2017-01-05 LAB — URINE CULTURE

## 2017-01-05 LAB — PATHOLOGIST SMEAR REVIEW

## 2017-01-05 MED ORDER — SODIUM BICARBONATE 8.4 % IV SOLN
INTRAVENOUS | Status: DC
  Administered 2017-01-05: 12:00:00 via INTRAVENOUS
  Filled 2017-01-05 (×2): qty 150

## 2017-01-05 MED ORDER — POTASSIUM CHLORIDE 10 MEQ/100ML IV SOLN
10.0000 meq | INTRAVENOUS | Status: AC
  Administered 2017-01-05 (×2): 10 meq via INTRAVENOUS
  Filled 2017-01-05 (×2): qty 100

## 2017-01-05 MED ORDER — MEROPENEM 1 G IV SOLR
1.0000 g | Freq: Two times a day (BID) | INTRAVENOUS | Status: DC
  Administered 2017-01-05 – 2017-01-11 (×13): 1 g via INTRAVENOUS
  Filled 2017-01-05 (×13): qty 1

## 2017-01-05 NOTE — Progress Notes (Signed)
Pharmacy Antibiotic Note  Vanessa Massey is a 71 y.o. female admitted on 01/03/2017 with ESBL UTI.  Pharmacy has been consulted for meropenem dosing.  Plan: Meropenem 1g IV q12h Follow LOT and renal function  Height: 4\' 11"  (149.9 cm) Weight: 97 lb (44 kg) IBW/kg (Calculated) : 43.2  Temp (24hrs), Avg:98.1 F (36.7 C), Min:97.6 F (36.4 C), Max:98.6 F (37 C)   Recent Labs Lab 01/03/17 1058 01/03/17 1300 01/03/17 1351 01/03/17 1800 01/03/17 1857 01/04/17 0359 01/04/17 0510 01/04/17 2357 01/05/17 0425  WBC  --  36.9*  --   --   --   --  36.3*  --  22.5*  CREATININE 2.10*  --   --   --  1.92* 1.66*  --  1.09* 1.01*  LATICACIDVEN  --   --  2.23* 1.7  --   --   --   --   --     Estimated Creatinine Clearance: 35.9 mL/min (A) (by C-G formula based on SCr of 1.01 mg/dL (H)).    Allergies  Allergen Reactions  . Bee Pollen Shortness Of Breath  . Morphine And Related Nausea And Vomiting  . Varenicline Other (See Comments)    Nightmares, Nausea,depressive thoughts  . Codeine Nausea Only  . Neomycin Rash    Antimicrobials this admission: Vanc 6/13 x1 CTX 6/13>>6/15 Meropenem 6/15>>  Microbiology results:  patient refused blood cxs  6/13 urine: ESBL EColi  Anisia Leija D. Dearl Rudden, PharmD, BCPS Clinical Pharmacist Pager: [phone] Clinical Phone for 01/05/2017 until 3:30pm: [phone] If after 3:30pm, please call main pharmacy at [phone] 01/05/2017 9:55 AM

## 2017-01-05 NOTE — Progress Notes (Signed)
Attempted to get pt to the chair. Pt screamed in pain during the 3 attempts out of bed. Pt refused to move and said she is going getting up. Will ctm

## 2017-01-05 NOTE — Progress Notes (Signed)
BMP was drawn and results were paged to Schorr,NP. Schorr,NP returned page and placed order for 2 runs of potassium. Will administer as ordered. Will continue to monitor and treat per MD orders.

## 2017-01-05 NOTE — Progress Notes (Signed)
PROGRESS NOTE   Vanessa Massey  ZOX:[id number]    DOB: 08/07/1945    DOA: 01/03/2017  PCP: Lindwood Qua, MD   I have briefly reviewed patients previous medical records in [facility].  Brief Narrative:  71 year old female, lives alone, ambulates with the help of a walker, PMH of asthma/COPD not on home oxygen, told to have hepatitis at age 82, status post partial gastrectomy and pancreas surgery, depression, chronic low back pain, sustained a mechanical fall 2 days prior to admission when she was ambulating to the bathroom without her walker, denies any injuries, bleeding or LOC, able to get up by herself, presents with worsening low back pain, left hip pain. She had been seen at [facility] but no imaging studies performed. Home oxycodone did not provide relief. Also reported weakness but no dizziness or lightheadedness. No dysuria, urinary frequency, fever or chills. 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 Admitted for further management. New onset A. fib with RVR on 6/14.Confirmed ESBL Escherichia coli UTI. Reverted to sinus rhythm. Mobilize. PT evaluation.   Assessment & Plan:   Principal Problem:   Acute respiratory failure (HCC) Active Problems:   Atherosclerosis of native artery of extremity with intermittent claudication (HCC)   Acidosis   Acute kidney injury (HCC)   Dehydration   Hyperkalemia   Sepsis (HCC)   Anemia   Leukocytosis   Hypoglycemia   Hyponatremia   COPD (chronic obstructive pulmonary disease) (HCC)   Fall   AKI (acute kidney injury) (HCC)   Acute pyelonephritis   Protein-calorie malnutrition, severe   1. ESBL Escherichia coli UTI: Patient denies urinary symptoms but urine microscopy and emphysematous cystitis seen on lumbar spine x-ray are concerning. I do not believe that she was truly  septic on admission. Empirically had been started on IV ceftriaxone on admission. Culture sensitivity results confirm ESBL Escherichia coli and hence IV ceftriaxone changed to IV meropenem on 6/15. 2. A. fib with RVR, new onset: Noted on 6/14. Asymptomatic. When necessary IV metoprolol 2.5-5 milligrams every 6 hours. Started metoprolol 25 MG by mouth twice a day and titrate up as tolerated. Check 2-D echo and TSH: 0.598/normal. Patient reverted to sinus rhythm on 6/14 at approximately 1:20 PM. Has remained in sinus rhythm. Poor candidate for long-term anticoagulation given extreme frailness, history of fall and fall risk, severe anemia. Anticoagulation decision can be revisited in the future if her condition changes/improves. 3. Transient hypoxia/acute respiratory failure with hypoxia: She was 86% on room air in the ED. May have been related to acute illness complicating COPD. No clinical bronchospasm. Chest x-ray without acute findings. Resolved. Stable. 4. COPD: When necessary broncho-dilator nebulizations. No clinical bronchospasm. Stable. 5. Acute kidney injury: Probably due to dehydration. Presented with creatinine of 2.14. CK normal. Creatinine normal/0.7 on 08/30/16. Resolved. 6. Hyperkalemia: Resolved. Received Kayexalate in ED. Aldactone on hold. Potassium up again to 5.1. Monitor closely. 7. Non-anion gap metabolic acidosis: Unclear etiology. No GI losses reported.? RTA 4-? Etiology. She had bicarbonate of 10 on admission. Bicarbonate has plateaued in the 15-16 range over the last 24 hours. Change to IV bicarbonate drip. Follow BMP in a.m. 8. Dehydration with hyponatremia: Possibly related to poor oral intake. Continue gentle IV fluids. Improved. 9. Chronic microcytic anemia: Presented with hemoglobin of 8.3 which dropped to 6.6 after hydration. Anemia panel reviewed,? Iron deficiency versus chronic disease. Transfusing 1  unit of PRBC. Follow CBCs. Denies history of GI bleed symptoms. Check FOBT.  States that she had a colonoscopy 12 years ago and reportedly normal. Baseline hemoglobin in the 8-9 range. Hemoglobin up from 6.6 > 8.3 after 1 unit PRBC 6/14. Follow CBC in a.m. 10. Hypoglycemia: Probably due to poor oral intake. Serum glucose on presentation <20. Monitor CBGs closely. Resolved. 11. Elevated INR/abnormal LFTs/hepatitis history:? Cirrhosis. Check acute hepatitis panel: Negative and HIV status.: Nonreactive 12. Leukocytosis:? Related to acute infectious etiology versus others. Review of labs in care everywhere shows that she has chronic leukocytosis at least since 2015 ranging between tens and 40, etiology of this is unclear. Check peripheral smear. Trend CBCs for improvement and may need to consider outpatient hematology consultation. Leukocytosis is improved from 36.3 > 22.5. Continue to follow CBCs. 13. Severe protein calorie malnutrition: Dietitian consultation. 14. Low back and left hip pain: Imaging negative for fractures. PT evaluation. Pain management. Patient reports chronic low back pain for 8 years and has been on opioids since then. Mobilize with PT today. 15. Adult failure to thrive: Probably multifactorial related to age and multiple severe comorbidities. 16. Depression: Continue venlafaxine. No suicidal or homicidal ideations reported.   DVT prophylaxis: Subcutaneous heparin Code Status: Full Family Communication: None at bedside Disposition: To be determined, possibly SNF.   Consultants:  None   Procedures:  LUE PICC line.   Antimicrobials:  IV ceftriaxone -discontinued. IV meropenem.   Subjective: Ongoing chronic low back pain, may be slightly worse than usual. Ongoing left hip pain without radiation. No chest pain, dyspnea or palpitations reported.  ROS: Denies headache, earache, sore throat, nausea, vomiting, abdominal pain. Reports constipation. Denies melena or blood in stools. States last colonoscopy was approximately 12 years ago. Denies dysuria  or urinary frequency. No chest pain, cough, dyspnea or palpitations. No other bleeding reported.  Objective:  Vitals:   01/04/17 1535 01/04/17 2113 01/05/17 0458 01/05/17 0751  BP: (!) 130/49 (!) 137/57 (!) 151/56   Pulse: 84 (!) 58 71   Resp: 18 20 17    Temp: 98.4 F (36.9 C) 98.1 F (36.7 C) 98 F (36.7 C)   TempSrc: Oral Oral Oral   SpO2: 95% 97% 96% 96%  Weight:   44 kg (97 lb)   Height:        Examination:  General exam: Pleasant middle-aged female, small built, frail and cachectic, chronically ill looking, lying comfortably supine in bed.Looks better than she did yesterday. Respiratory system: Clear to auscultation. Respiratory effort normal. No change. Cardiovascular system: S1 and S2 heard, RRR. No JVD, murmurs or pedal edema. Telemetry: Sinus rhythm. Reverted from A. fib with RVR to SR on 01/04/17 at approximately 1:20 PM. Gastrointestinal system: Abdomen is nondistended, soft and nontender. No organomegaly or masses felt. Normal bowel sounds heard. No change. Central nervous system: Alert and oriented. No focal neurological deficits. Extremities: Symmetric 5 x 5 power. No bruising over left hip or lower back. No change. Skin: No rashes, lesions or ulcers Psychiatry: Judgement and insight appear normal. Mood & affect flat.    Data Reviewed: I have personally reviewed following labs and imaging studies  CBC:  Recent Labs Lab 01/03/17 1058 01/03/17 1300 01/04/17 0510 01/05/17 0425  WBC  --  36.9* 36.3* 22.5*  NEUTROABS  --  33.6*  --  19.0*  HGB 9.9* 8.3* 6.6* 8.3*  HCT 29.0* 26.2* 20.2* 24.8*  MCV  --  75.1* 73.5* 74.0*  PLT  --  456* 376 309  Basic Metabolic Panel:  Recent Labs Lab 01/03/17 1053 01/03/17 1058 01/03/17 1857 01/04/17 0359 01/04/17 2357 01/05/17 0425  NA 130* 127* 129* 133* 134* 135  K 6.2* 5.9* 5.6* 4.6 3.4* 5.1  CL 98* 102 101 106 109 110  CO2 10*  --  12* 15* 15* 16*  GLUCOSE <20* 35* 121* 106* 93 85  BUN 47* 47* 45* 42* 26*  22*  CREATININE 2.14* 2.10* 1.92* 1.66* 1.09* 1.01*  CALCIUM 7.6*  --  6.8* 6.7* 7.3* 7.4*   Liver Function Tests:  Recent Labs Lab 01/04/17 0805  AST 176*  ALT 75*  ALKPHOS 342*  BILITOT 2.0*  PROT 4.5*  ALBUMIN 1.5*   Coagulation Profile:  Recent Labs Lab 01/03/17 1730  INR 1.74   Cardiac Enzymes:  Recent Labs Lab 01/03/17 1053  CKTOTAL 135   HbA1C: No results for input(s): HGBA1C in the last 72 hours. CBG:  Recent Labs Lab 01/04/17 0758 01/04/17 1202 01/04/17 1634 01/04/17 2242 01/05/17 0751  GLUCAP 111* 88 109* 91 76    Recent Results (from the past 240 hour(s))  Urine culture     Status: Abnormal   Collection Time: 01/03/17  3:45 PM  Result Value Ref Range Status   Specimen Description URINE, CATHETERIZED  Final   Special Requests NONE  Final   Culture (A)  Final    >=100,000 COLONIES/mL ESCHERICHIA COLI Confirmed Extended Spectrum Beta-Lactamase Producer (ESBL)    Report Status 01/05/2017 FINAL  Final   Organism ID, Bacteria ESCHERICHIA COLI (A)  Final      Susceptibility   Escherichia coli - MIC*    AMPICILLIN >=32 RESISTANT Resistant     CEFAZOLIN >=64 RESISTANT Resistant     CEFTRIAXONE >=64 RESISTANT Resistant     CIPROFLOXACIN >=4 RESISTANT Resistant     GENTAMICIN <=1 SENSITIVE Sensitive     IMIPENEM <=0.25 SENSITIVE Sensitive     NITROFURANTOIN >=512 RESISTANT Resistant     TRIMETH/SULFA <=20 SENSITIVE Sensitive     AMPICILLIN/SULBACTAM >=32 RESISTANT Resistant     PIP/TAZO <=4 SENSITIVE Sensitive     Extended ESBL POSITIVE Resistant     * >=100,000 COLONIES/mL ESCHERICHIA COLI         Radiology Studies: Dg Lumbar Spine Complete  Result Date: 01/03/2017 CLINICAL DATA:  Fall Monday with continued lower back pain. Initial encounter. EXAM: LUMBAR SPINE - COMPLETE 4+ VIEW COMPARISON:  None. FINDINGS: Mild superior endplate irregularity at L4, chronic appearing and with accentuated spurring. No acute fracture suspected. Mild disc  narrowing at L3-4. L4-5 and L5-S1 mild facet spurring. Osteopenia. Nonspecific L1-2 disc calcification. Extensive atherosclerotic calcification. Linear, scar-like opacity seen over the anterior lung. These results were called by telephone at the time of interpretation on 01/03/2017 at 11:57 am to Dr. Sharilyn Sites , who verbally acknowledged these results. IMPRESSION: 1. Findings of emphysematous cystitis. 2. No acute finding in the lumbar spine. Electronically Signed   By: Marnee Spring M.D.   On: 01/03/2017 11:58   Dg Pelvis 1-2 Views  Result Date: 01/03/2017 CLINICAL DATA:  Less post fall 2 days ago with persistent left groin and lateral femur pain. Also tingling in the left foot. EXAM: PELVIS - 1-2 VIEW COMPARISON:  None in PACs FINDINGS: The bones are subjectively osteopenic. No acute pelvic fracture is observed. There is no lytic or blastic pelvic lesion. The hip joint spaces are reasonably well maintained. The articular surfaces of the femoral heads and acetabuli remains smoothly rounded. IMPRESSION: No acute fracture of the  pelvis is observed. Electronically Signed   By: David  SwazilandJordan M.D.   On: 01/03/2017 11:56   Dg Chest Port 1 View  Result Date: 01/03/2017 CLINICAL DATA:  Fall.  Elevated white count. EXAM: PORTABLE CHEST 1 VIEW COMPARISON:  None. FINDINGS: The cardiomediastinal silhouette is unremarkable. Minimal mid right lung subsegmental scar/atelectasis or minor fissure pleural thickening is noted. There is no evidence of focal airspace disease, pulmonary edema, suspicious pulmonary nodule/mass, pleural effusion, or pneumothorax. No acute bony abnormalities are identified. IMPRESSION: No evidence of acute abnormality. Electronically Signed   By: Harmon PierJeffrey  Hu M.D.   On: 01/03/2017 14:06   Dg Femur Min 2 Views Left  Result Date: 01/03/2017 CLINICAL DATA:  Status post fall.  Left hip pain. EXAM: LEFT FEMUR 2 VIEWS COMPARISON:  None. FINDINGS: There is no evidence of fracture or other focal  bone lesions. Soft tissues are unremarkable. Peripheral vascular atherosclerotic disease. IMPRESSION: No acute osseous injury of the left femur. Given the patient's age and osteopenia, if there is persistent clinical concern for an occult hip fracture, a MRI of the hip is recommended for increased sensitivity. Electronically Signed   By: Elige KoHetal  Patel   On: 01/03/2017 11:55        Scheduled Meds: . feeding supplement (ENSURE ENLIVE)  237 mL Oral TID BM  . heparin  5,000 Units Subcutaneous Q8H  . linaclotide  145 mcg Oral QAC breakfast  . lipase/protease/amylase  24,000 Units Oral TID  . metoprolol tartrate  25 mg Oral BID  . mirtazapine  15 mg Oral QHS  . mometasone-formoterol  2 puff Inhalation BID  . multivitamin with minerals  1 tablet Oral Daily  . pantoprazole  40 mg Oral Daily  . venlafaxine XR  37.5 mg Oral Q breakfast   Continuous Infusions: . sodium chloride 75 mL/hr at 01/05/17 0616  . meropenem (MERREM) IV       LOS: 2 days     Hala Narula, MD, FACP, FHM. [facility] Pager [phone]  If 7PM-7AM, please contact night-coverage [email or web address] Password [email or web address] 01/05/2017, 10:19 AM

## 2017-01-06 ENCOUNTER — Inpatient Hospital Stay (HOSPITAL_COMMUNITY): Payer: Medicare Other

## 2017-01-06 DIAGNOSIS — I480 Paroxysmal atrial fibrillation: Secondary | ICD-10-CM

## 2017-01-06 DIAGNOSIS — E876 Hypokalemia: Secondary | ICD-10-CM

## 2017-01-06 DIAGNOSIS — R627 Adult failure to thrive: Secondary | ICD-10-CM

## 2017-01-06 LAB — CBC WITH DIFFERENTIAL/PLATELET
BASOS ABS: 0 10*3/uL (ref 0.0–0.1)
Basophils Relative: 0 %
EOS PCT: 2 %
Eosinophils Absolute: 0.4 10*3/uL (ref 0.0–0.7)
HCT: 23.5 % — ABNORMAL LOW (ref 36.0–46.0)
HEMOGLOBIN: 7.7 g/dL — AB (ref 12.0–15.0)
LYMPHS ABS: 2.1 10*3/uL (ref 0.7–4.0)
LYMPHS PCT: 12 %
MCH: 24.6 pg — AB (ref 26.0–34.0)
MCHC: 32.8 g/dL (ref 30.0–36.0)
MCV: 75.1 fL — AB (ref 78.0–100.0)
Monocytes Absolute: 1.1 10*3/uL — ABNORMAL HIGH (ref 0.1–1.0)
Monocytes Relative: 6 %
NEUTROS PCT: 80 %
Neutro Abs: 14 10*3/uL — ABNORMAL HIGH (ref 1.7–7.7)
PLATELETS: 175 10*3/uL (ref 150–400)
RBC: 3.13 MIL/uL — AB (ref 3.87–5.11)
RDW: 16.9 % — ABNORMAL HIGH (ref 11.5–15.5)
WBC: 17.6 10*3/uL — AB (ref 4.0–10.5)

## 2017-01-06 LAB — PROTIME-INR
INR: 1.34
Prothrombin Time: 16.7 seconds — ABNORMAL HIGH (ref 11.4–15.2)

## 2017-01-06 LAB — COMPREHENSIVE METABOLIC PANEL
ALK PHOS: 258 U/L — AB (ref 38–126)
ALT: 50 U/L (ref 14–54)
AST: 84 U/L — AB (ref 15–41)
Albumin: 1.4 g/dL — ABNORMAL LOW (ref 3.5–5.0)
Anion gap: 5 (ref 5–15)
BUN: 12 mg/dL (ref 6–20)
CALCIUM: 7.1 mg/dL — AB (ref 8.9–10.3)
CHLORIDE: 105 mmol/L (ref 101–111)
CO2: 25 mmol/L (ref 22–32)
CREATININE: 0.62 mg/dL (ref 0.44–1.00)
GFR calc Af Amer: >60 mL/min (ref >60)
Glucose, Bld: 119 mg/dL — ABNORMAL HIGH (ref 65–99)
Potassium: 3.3 mmol/L — ABNORMAL LOW (ref 3.5–5.1)
SODIUM: 135 mmol/L (ref 135–145)
Total Bilirubin: 0.7 mg/dL (ref 0.3–1.2)
Total Protein: 4.2 g/dL — ABNORMAL LOW (ref 6.5–8.1)

## 2017-01-06 MED ORDER — SENNA 8.6 MG PO TABS
1.0000 | ORAL_TABLET | Freq: Every day | ORAL | Status: DC
  Administered 2017-01-11: 8.6 mg via ORAL
  Filled 2017-01-06 (×5): qty 1

## 2017-01-06 MED ORDER — CYCLOBENZAPRINE HCL 5 MG PO TABS
5.0000 mg | ORAL_TABLET | Freq: Three times a day (TID) | ORAL | Status: DC
  Administered 2017-01-06 – 2017-01-11 (×16): 5 mg via ORAL
  Filled 2017-01-06 (×17): qty 1

## 2017-01-06 MED ORDER — POTASSIUM CHLORIDE CRYS ER 20 MEQ PO TBCR
40.0000 meq | EXTENDED_RELEASE_TABLET | Freq: Once | ORAL | Status: AC
  Administered 2017-01-06: 40 meq via ORAL
  Filled 2017-01-06: qty 2

## 2017-01-06 MED ORDER — POLYETHYLENE GLYCOL 3350 17 G PO PACK
17.0000 g | PACK | Freq: Every day | ORAL | Status: DC
  Filled 2017-01-06 (×4): qty 1

## 2017-01-06 NOTE — Progress Notes (Signed)
Pt refused to have MRI. MD made aware.

## 2017-01-06 NOTE — Progress Notes (Signed)
Pt came to MRI department, pt refused test, RN notified.

## 2017-01-06 NOTE — Progress Notes (Addendum)
PROGRESS NOTE   Vanessa Massey  RUE:[id number]    DOB: Jun 17, 1946    DOA: 01/03/2017  PCP: Lindwood Qua, MD   I have briefly reviewed patients previous medical records in [facility].  Brief Narrative:  71 year old female, lives alone, ambulates with the help of a walker, PMH of asthma/COPD not on home oxygen, told to have hepatitis at age 86, status post partial gastrectomy and pancreas surgery, depression, chronic low back pain, sustained a mechanical fall 2 days prior to admission when she was ambulating to the bathroom without her walker, denies any injuries, bleeding or LOC, able to get up by herself, presents with worsening low back pain, left hip pain. She had been seen at [facility] but no imaging studies performed. Home oxycodone did not provide relief. Also reported weakness but no dizziness or lightheadedness. No dysuria, urinary frequency, fever or chills. 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 Admitted for further management. New onset A. fib with RVR on 6/14.Confirmed ESBL Escherichia coli UTI. PAF with CVR. Ongoing left hip pain >refused MRI hip on 6/16. As per daughter, beginning of this year was in SNF for 3 months, then discharged to patient's brother's home, was on home hospice-able to come off, and returned home a week ago.   Assessment & Plan:   Principal Problem:   Acute respiratory failure (HCC) Active Problems:   Atherosclerosis of native artery of extremity with intermittent claudication (HCC)   Acidosis   Acute kidney injury (HCC)   Dehydration   Hyperkalemia   Sepsis (HCC)   Anemia   Leukocytosis   Hypoglycemia   Hyponatremia   COPD (chronic obstructive pulmonary disease) (HCC)   Fall   AKI (acute kidney injury) (HCC)   Acute pyelonephritis   Protein-calorie malnutrition,  severe   1. ESBL Escherichia coli UTI: Patient denies urinary symptoms but urine microscopy and emphysematous cystitis seen on lumbar spine x-ray are concerning. No sepsis on admission. Empirically had been started on IV ceftriaxone on admission. Culture sensitivity results confirm ESBL Escherichia coli and hence IV ceftriaxone changed to IV meropenem on 6/15. Had fever of 100.7 on 5/16. No change in treatment. Monitor. 2. A. fib with RVR, new onset: Now PAF w CVR. Continue metoprolol 25 MG PO BID. Check 2-D echo. TSH: 0.598/normal. Poor candidate for long-term anticoagulation given extreme frailness, history of falls and fall risk, severe anemia. Anticoagulation decision can be revisited in the future if her condition changes/improves. 3. Transient hypoxia/acute respiratory failure with hypoxia: She was 86% on room air in the ED. May have been related to acute illness complicating COPD. No clinical bronchospasm. Chest x-ray without acute findings. Resolved. Stable. 4. COPD: When necessary broncho-dilator nebulizations. No clinical bronchospasm. Stable. 5. Acute kidney injury: Resolved. 6. Hyperkalemia/hypokalemia: Hyperkalemia resolved. Aldactone on hold. Replace potassium and follow. 7. Non-anion gap metabolic acidosis: Unclear etiology. No GI losses reported.? RTA 4-? Etiology. She had bicarbonate of 10 on admission. Resolved after IV bicarbonate drip. Follow BMP. 8. Dehydration with hyponatremia: Resolved. At high risk for recurrent dehydration due to inconsistent oral intake. 9. Chronic microcytic anemia: Presented with hemoglobin of 8.3 which dropped to 6.6 after hydration. Anemia panel reviewed,? Iron deficiency versus chronic disease. Denies history of GI bleed symptoms. Check FOBT-not done. States that she had a colonoscopy 12 years ago and reportedly normal. Baseline hemoglobin in the 8-9 range. Hemoglobin up  from 6.6 > 8.3 after 1 unit PRBC 6/14. Hemoglobin dropped to 7.7, possibly  equilibration. Follow CBC in a.m. 10. Hypoglycemia: Probably due to poor oral intake. Serum glucose on presentation <20. Monitor CBGs closely. Resolved. 11. Elevated INR/abnormal LFTs/hepatitis history:? Cirrhosis. Acute hepatitis panel: Negative and HIV status.: Nonreactive.? Nutritional. INR down to 1.34. 12. Leukocytosis:? Related to acute infectious etiology versus others. Review of labs in care everywhere shows that she has chronic leukocytosis at least since 2015 ranging between tens and 40, etiology of this is unclear. Check peripheral smear. Trend CBCs for improvement and may need to consider outpatient hematology consultation. Leukocytosis continues to improve 36.3 > 22.5 >17.6. Continue to follow CBCs. 13. Severe protein calorie malnutrition: Dietitian consultation. 14. Low back and left hip pain: X-rays negative for fractures. PT evaluation. Pain management. Patient reports chronic low back pain for 8 years and has been on opioids since then. Ongoing hip pain. Refused to get out of bed. Added Flexeril and K pad. Agree to have MRI of left hip to look for occult fractures but then refused when she went down to radiology. 15. Adult failure to thrive: Probably multifactorial related to age and multiple severe comorbidities. We will get palliative care consult to assess goals of care. 16. Depression: Continue venlafaxine. No suicidal or homicidal ideations reported. 17. Severe weight loss: Reported by patient's daughter on 6/16. Apparently was released from hospice. Etiology felt to be due to poor oral intake. Outpatient evaluation as deemed necessary. 18. Chronic pancreatitis: Based on pancreatic supplements PTA. Could also because of severe weight loss. No diarrhea.   DVT prophylaxis: Subcutaneous heparin Code Status: Full Family Communication: Discussed in detail with patient's daughter at bedside. Disposition: To be determined, possibly SNF.   Consultants:  Palliative care  team  Procedures:  LUE PICC line.   Antimicrobials:  IV ceftriaxone -discontinued. IV meropenem.   Subjective: Refused to get out of bed yesterday. Reports severe left hip pain. Initially agreed to MRI but went down to radiology and refused it. BM 3 days ago. Ate well at breakfast.   ROS: No chest pain, dyspnea, dizziness or lightheadedness.   Objective:  Vitals:   01/06/17 0500 01/06/17 0620 01/06/17 0823 01/06/17 1052  BP:  (!) 131/53  120/65  Pulse:  73  71  Resp:  18    Temp:  98.5 F (36.9 C)    TempSrc:      SpO2:  90% 91%   Weight: 44.5 kg (98 lb)     Height:        Examination:  General exam: Small built, frail, chronically ill-looking middle-aged female lying comfortably supine in bed  Respiratory system: clear to auscultation without increased work of breathing  Cardiovascular system: S1 and S2 heard, RRR. No JVD, murmurs or pedal edema. Telemetry: PAF with controlled ventricular rate.  Gastrointestinal system: Abdomen is nondistended, soft and nontender. No organomegaly or masses felt. Normal bowel sounds heard. No change. Central nervous system: Alert and oriented. No focal neurological deficits. Extremities: Symmetric 5 x 5 power. No bruising over left hip or lower back. Painful range of movements left hip. Skin: No rashes, lesions or ulcers Psychiatry: Judgement and insight appear normal. Mood & affect flat.    Data Reviewed: I have personally reviewed following labs and imaging studies  CBC:  Recent Labs Lab 01/03/17 1058 01/03/17 1300 01/04/17 0510 01/05/17 0425 01/06/17 0337  WBC  --  36.9* 36.3* 22.5* 17.6*  NEUTROABS  --  33.6*  --  19.0*  14.0*  HGB 9.9* 8.3* 6.6* 8.3* 7.7*  HCT 29.0* 26.2* 20.2* 24.8* 23.5*  MCV  --  75.1* 73.5* 74.0* 75.1*  PLT  --  456* 376 309 175   Basic Metabolic Panel:  Recent Labs Lab 01/03/17 1857 01/04/17 0359 01/04/17 2357 01/05/17 0425 01/06/17 0337  NA 129* 133* 134* 135 135  K 5.6* 4.6 3.4* 5.1  3.3*  CL 101 106 109 110 105  CO2 12* 15* 15* 16* 25  GLUCOSE 121* 106* 93 85 119*  BUN 45* 42* 26* 22* 12  CREATININE 1.92* 1.66* 1.09* 1.01* 0.62  CALCIUM 6.8* 6.7* 7.3* 7.4* 7.1*   Liver Function Tests:  Recent Labs Lab 01/04/17 0805 01/06/17 0337  AST 176* 84*  ALT 75* 50  ALKPHOS 342* 258*  BILITOT 2.0* 0.7  PROT 4.5* 4.2*  ALBUMIN 1.5* 1.4*   Coagulation Profile:  Recent Labs Lab 01/03/17 1730 01/06/17 0337  INR 1.74 1.34   Cardiac Enzymes:  Recent Labs Lab 01/03/17 1053  CKTOTAL 135   HbA1C: No results for input(s): HGBA1C in the last 72 hours. CBG:  Recent Labs Lab 01/04/17 1634 01/04/17 2242 01/05/17 0751 01/05/17 1147 01/05/17 1716  GLUCAP 109* 91 76 82 90    Recent Results (from the past 240 hour(s))  Urine culture     Status: Abnormal   Collection Time: 01/03/17  3:45 PM  Result Value Ref Range Status   Specimen Description URINE, CATHETERIZED  Final   Special Requests NONE  Final   Culture (A)  Final    >=100,000 COLONIES/mL ESCHERICHIA COLI Confirmed Extended Spectrum Beta-Lactamase Producer (ESBL)    Report Status 01/05/2017 FINAL  Final   Organism ID, Bacteria ESCHERICHIA COLI (A)  Final      Susceptibility   Escherichia coli - MIC*    AMPICILLIN >=32 RESISTANT Resistant     CEFAZOLIN >=64 RESISTANT Resistant     CEFTRIAXONE >=64 RESISTANT Resistant     CIPROFLOXACIN >=4 RESISTANT Resistant     GENTAMICIN <=1 SENSITIVE Sensitive     IMIPENEM <=0.25 SENSITIVE Sensitive     NITROFURANTOIN >=512 RESISTANT Resistant     TRIMETH/SULFA <=20 SENSITIVE Sensitive     AMPICILLIN/SULBACTAM >=32 RESISTANT Resistant     PIP/TAZO <=4 SENSITIVE Sensitive     Extended ESBL POSITIVE Resistant     * >=100,000 COLONIES/mL ESCHERICHIA COLI         Radiology Studies: No results found.      Scheduled Meds: . cyclobenzaprine  5 mg Oral TID  . feeding supplement (ENSURE ENLIVE)  237 mL Oral TID BM  . heparin  5,000 Units Subcutaneous  Q8H  . linaclotide  145 mcg Oral QAC breakfast  . lipase/protease/amylase  24,000 Units Oral TID  . metoprolol tartrate  25 mg Oral BID  . mirtazapine  15 mg Oral QHS  . mometasone-formoterol  2 puff Inhalation BID  . multivitamin with minerals  1 tablet Oral Daily  . pantoprazole  40 mg Oral Daily  . venlafaxine XR  37.5 mg Oral Q breakfast   Continuous Infusions: . meropenem (MERREM) IV Stopped (01/06/17 1105)     LOS: 3 days     Armas Mcbee, MD, FACP, FHM. [facility] Pager [phone]  If 7PM-7AM, please contact night-coverage [email or web address] Password [email or web address] 01/06/2017, 2:21 PM

## 2017-01-07 DIAGNOSIS — J9601 Acute respiratory failure with hypoxia: Secondary | ICD-10-CM

## 2017-01-07 DIAGNOSIS — E43 Unspecified severe protein-calorie malnutrition: Secondary | ICD-10-CM

## 2017-01-07 DIAGNOSIS — E875 Hyperkalemia: Secondary | ICD-10-CM

## 2017-01-07 DIAGNOSIS — Z515 Encounter for palliative care: Secondary | ICD-10-CM

## 2017-01-07 DIAGNOSIS — N10 Acute tubulo-interstitial nephritis: Secondary | ICD-10-CM

## 2017-01-07 DIAGNOSIS — Z7189 Other specified counseling: Secondary | ICD-10-CM

## 2017-01-07 LAB — CBC WITH DIFFERENTIAL/PLATELET
Basophils Absolute: 0 10*3/uL (ref 0.0–0.1)
Basophils Relative: 0 %
EOS ABS: 0.5 10*3/uL (ref 0.0–0.7)
EOS PCT: 3 %
HCT: 23.3 % — ABNORMAL LOW (ref 36.0–46.0)
HEMOGLOBIN: 7.6 g/dL — AB (ref 12.0–15.0)
Lymphocytes Relative: 15 %
Lymphs Abs: 2.6 10*3/uL (ref 0.7–4.0)
MCH: 24.9 pg — ABNORMAL LOW (ref 26.0–34.0)
MCHC: 32.6 g/dL (ref 30.0–36.0)
MCV: 76.4 fL — ABNORMAL LOW (ref 78.0–100.0)
MONOS PCT: 8 %
Monocytes Absolute: 1.3 10*3/uL — ABNORMAL HIGH (ref 0.1–1.0)
NEUTROS PCT: 74 %
Neutro Abs: 13.1 10*3/uL — ABNORMAL HIGH (ref 1.7–7.7)
Platelets: 148 10*3/uL — ABNORMAL LOW (ref 150–400)
RBC: 3.05 MIL/uL — ABNORMAL LOW (ref 3.87–5.11)
RDW: 17.2 % — ABNORMAL HIGH (ref 11.5–15.5)
WBC: 17.5 10*3/uL — ABNORMAL HIGH (ref 4.0–10.5)

## 2017-01-07 LAB — BASIC METABOLIC PANEL
Anion gap: 5 (ref 5–15)
BUN: 8 mg/dL (ref 6–20)
CO2: 25 mmol/L (ref 22–32)
Calcium: 7.1 mg/dL — ABNORMAL LOW (ref 8.9–10.3)
Chloride: 104 mmol/L (ref 101–111)
Creatinine, Ser: 0.52 mg/dL (ref 0.44–1.00)
GFR calc Af Amer: >60 mL/min (ref >60)
GLUCOSE: 108 mg/dL — AB (ref 65–99)
POTASSIUM: 3.7 mmol/L (ref 3.5–5.1)
Sodium: 134 mmol/L — ABNORMAL LOW (ref 135–145)

## 2017-01-07 LAB — MRSA PCR SCREENING: MRSA by PCR: POSITIVE — AB

## 2017-01-07 LAB — OCCULT BLOOD X 1 CARD TO LAB, STOOL: FECAL OCCULT BLD: POSITIVE — AB

## 2017-01-07 MED ORDER — LIDOCAINE 5 % EX PTCH
2.0000 | MEDICATED_PATCH | Freq: Every day | CUTANEOUS | Status: DC
  Administered 2017-01-07 – 2017-01-11 (×5): 2 via TRANSDERMAL
  Filled 2017-01-07 (×5): qty 2

## 2017-01-07 MED ORDER — OXYCODONE HCL 5 MG PO TABS
20.0000 mg | ORAL_TABLET | ORAL | Status: DC | PRN
  Administered 2017-01-07 – 2017-01-08 (×4): 20 mg via ORAL
  Filled 2017-01-07 (×4): qty 4

## 2017-01-07 MED ORDER — OXYCODONE HCL ER 10 MG PO T12A: 40.0000 mg | EXTENDED_RELEASE_TABLET | Freq: Two times a day (BID) | ORAL | Status: DC

## 2017-01-07 NOTE — Progress Notes (Signed)
Received report from Evan at Bedside. Patient on contact precaution for EDBL. Alert and oriented X4, patient states her pain is 9/10. Has received pain medication.  She is from home alone, assist x 1, and High Fall risk. Yellow socks, armband, low bed and pads are on this patient. Has been incontinent. Fecal for occult sent down during report. At this time, safety maintained.

## 2017-01-07 NOTE — Progress Notes (Addendum)
PROGRESS NOTE    Vanessa Massey  ZOX:[id number] DOB: [birth year] DOA: 01/03/2017 PCP: Lindwood Qua, MD    Brief Narrative:  71 year old female who presented after a fall. Patient is known to have arthritis, asthma and hepatitis. Patient with generalized weakness and persistent lower back pain, with worsening symptoms after the fall. On initial physical examination blood pressure 150/62, heart rate 96, regular 16-24, oxygen saturation 98%, she had dry mucous membranes, heart S1-S2 present rhythmic, lungs with no wheezing rales or rhonchi, abdomen was soft nontender, lower extremities no edema. Sodium 130, potassium 6.2, chloride 98, bicarbonate 10, BUN 47, creatinine 2.14, white count 36.9, hemoglobin 8.3, hematocrit 26.2, platelets 456, urinalysis with too numerous count red blood cells, 6-30 white cells. Chest film with right midlung atelectasis, no other infiltrates. EKG, sinus rhythm, poor R-wave progression. X-rays of left femur, pelvis and lumbar spine with no fractures.   Patient was admitted with working diagnosis of urinary tract infection, present on admission. Patient developed atrial fibrillation, urine culture came positive for ESBL Escherichia coli.   Assessment & Plan:   Principal Problem:   Acute respiratory failure (HCC) Active Problems:   Atherosclerosis of native artery of extremity with intermittent claudication (HCC)   Acidosis   Acute kidney injury (HCC)   Dehydration   Hyperkalemia   Sepsis (HCC)   Anemia   Leukocytosis   Hypoglycemia   Hyponatremia   COPD (chronic obstructive pulmonary disease) (HCC)   Fall   AKI (acute kidney injury) (HCC)   Acute pyelonephritis   Protein-calorie malnutrition, severe   1. Sepsis due to e coli ESBL, present on admission. Will continua antibiotic therapy with meropenem, tolerating well, will continue to follow cell count, cultures and temperature curve.   2. New onset atrial fibrillation, clinically underermined. Will  continue rate control with metoprolol, holding anticoagulation due to risk of bleeding, continue antiplatelet therapy with asa.   3. COPD. Stable with no signs of exacerbation, will continue oxymetry monitoring and as needed supplemental 02 per [place].   4. AKI.  Renal function with cr at 0.52 with K at 3,7 and serum bicarbonate at 25, will continue to follow renal panel and electrolytes.   5. Back pain and ambulatory dysfunction. Will continue flexeril, lidocaine patch. Will get physical therapy and order to be out of bed as tolerated.   6. Severe protein calorie malnutrition. Continue nutritional supplementation.    7. Depression, Continue venlafaxine, mirtazapine.    DVT prophylaxis: Heparin  Code Status: Full  Family Communication: No family at the bedside  Disposition Plan:   Consultants:     Procedures:     Antimicrobials:  Meropenem.     Subjective: Patient feeling very weak and deconditioned, has been not ambulating, no nausea or vomiting, very poor oral intake and poor appetite.   Objective: Vitals:   01/06/17 2123 01/06/17 2124 01/07/17 0500 01/07/17 0848  BP: (!) 99/45  (!) 123/55 116/84  Pulse: 80 84 78 89  Resp: 18  18   Temp: 98.2 F (36.8 C)  98.5 F (36.9 C)   TempSrc: Oral  Oral   SpO2: (!) 88% 90% 92%   Weight:   42.6 kg (94 lb)   Height:        Intake/Output Summary (Last 24 hours) at 01/07/17 1043 Last data filed at 01/07/17 0954  Gross per 24 hour  Intake              200 ml  Output  700 ml  Net             -500 ml   Filed Weights   01/05/17 0458 01/06/17 0500 01/07/17 0500  Weight: 44 kg (97 lb) 44.5 kg (98 lb) 42.6 kg (94 lb)    Examination:  General exam: deconditioned and ill looking appearing E ENT. Mild pallor, oral mucosa dry, no icterus Respiratory system: Clear to auscultation. Respiratory effort normal. No wheezing, rales or rhonchi. Cardiovascular system: S1 & S2 heard, RRR. No JVD, murmurs, rubs, gallops or  clicks. No pedal edema. Gastrointestinal system: Abdomen is nondistended, soft and nontender. No organomegaly or masses felt. Normal bowel sounds heard. Central nervous system: Alert and oriented. No focal neurological deficits. Extremities: Symmetric 5 x 5 power. Skin: No rashes, lesions or ulcers    Data Reviewed: I have personally reviewed following labs and imaging studies  CBC:  Recent Labs Lab 01/03/17 1300 01/04/17 0510 01/05/17 0425 01/06/17 0337 01/07/17 0421  WBC 36.9* 36.3* 22.5* 17.6* 17.5*  NEUTROABS 33.6*  --  19.0* 14.0* 13.1*  HGB 8.3* 6.6* 8.3* 7.7* 7.6*  HCT 26.2* 20.2* 24.8* 23.5* 23.3*  MCV 75.1* 73.5* 74.0* 75.1* 76.4*  PLT 456* 376 309 175 148*   Basic Metabolic Panel:  Recent Labs Lab 01/04/17 0359 01/04/17 2357 01/05/17 0425 01/06/17 0337 01/07/17 0421  NA 133* 134* 135 135 134*  K 4.6 3.4* 5.1 3.3* 3.7  CL 106 109 110 105 104  CO2 15* 15* 16* 25 25  GLUCOSE 106* 93 85 119* 108*  BUN 42* 26* 22* 12 8  CREATININE 1.66* 1.09* 1.01* 0.62 0.52  CALCIUM 6.7* 7.3* 7.4* 7.1* 7.1*   GFR: Estimated Creatinine Clearance: 44.6 mL/min (by C-G formula based on SCr of 0.52 mg/dL). Liver Function Tests:  Recent Labs Lab 01/04/17 0805 01/06/17 0337  AST 176* 84*  ALT 75* 50  ALKPHOS 342* 258*  BILITOT 2.0* 0.7  PROT 4.5* 4.2*  ALBUMIN 1.5* 1.4*   No results for input(s): LIPASE, AMYLASE in the last 168 hours. No results for input(s): AMMONIA in the last 168 hours. Coagulation Profile:  Recent Labs Lab 01/03/17 1730 01/06/17 0337  INR 1.74 1.34   Cardiac Enzymes:  Recent Labs Lab 01/03/17 1053  CKTOTAL 135   BNP (last 3 results) No results for input(s): PROBNP in the last 8760 hours. HbA1C: No results for input(s): HGBA1C in the last 72 hours. CBG:  Recent Labs Lab 01/04/17 1634 01/04/17 2242 01/05/17 0751 01/05/17 1147 01/05/17 1716  GLUCAP 109* 91 76 82 90   Lipid Profile: No results for input(s): CHOL, HDL, LDLCALC,  TRIG, CHOLHDL, LDLDIRECT in the last 72 hours. Thyroid Function Tests:  Recent Labs  01/04/17 1227  TSH 0.598   Anemia Panel: No results for input(s): VITAMINB12, FOLATE, FERRITIN, TIBC, IRON, RETICCTPCT in the last 72 hours. Sepsis Labs:  Recent Labs Lab 01/03/17 1351 01/03/17 1730 01/03/17 1800  PROCALCITON  --  1.26  --   LATICACIDVEN 2.23*  --  1.7    Recent Results (from the past 240 hour(s))  Urine culture     Status: Abnormal   Collection Time: 01/03/17  3:45 PM  Result Value Ref Range Status   Specimen Description URINE, CATHETERIZED  Final   Special Requests NONE  Final   Culture (A)  Final    >=100,000 COLONIES/mL ESCHERICHIA COLI Confirmed Extended Spectrum Beta-Lactamase Producer (ESBL)    Report Status 01/05/2017 FINAL  Final   Organism ID, Bacteria ESCHERICHIA COLI (A)  Final  Susceptibility   Escherichia coli - MIC*    AMPICILLIN >=32 RESISTANT Resistant     CEFAZOLIN >=64 RESISTANT Resistant     CEFTRIAXONE >=64 RESISTANT Resistant     CIPROFLOXACIN >=4 RESISTANT Resistant     GENTAMICIN <=1 SENSITIVE Sensitive     IMIPENEM <=0.25 SENSITIVE Sensitive     NITROFURANTOIN >=512 RESISTANT Resistant     TRIMETH/SULFA <=20 SENSITIVE Sensitive     AMPICILLIN/SULBACTAM >=32 RESISTANT Resistant     PIP/TAZO <=4 SENSITIVE Sensitive     Extended ESBL POSITIVE Resistant     * >=100,000 COLONIES/mL ESCHERICHIA COLI         Radiology Studies: No results found.      Scheduled Meds: . cyclobenzaprine  5 mg Oral TID  . feeding supplement (ENSURE ENLIVE)  237 mL Oral TID BM  . heparin  5,000 Units Subcutaneous Q8H  . linaclotide  145 mcg Oral QAC breakfast  . lipase/protease/amylase  24,000 Units Oral TID  . metoprolol tartrate  25 mg Oral BID  . mirtazapine  15 mg Oral QHS  . mometasone-formoterol  2 puff Inhalation BID  . multivitamin with minerals  1 tablet Oral Daily  . pantoprazole  40 mg Oral Daily  . polyethylene glycol  17 g Oral Daily    . senna  1 tablet Oral Daily  . venlafaxine XR  37.5 mg Oral Q breakfast   Continuous Infusions: . meropenem (MERREM) IV Stopped (01/07/17 0932)     LOS: 4 days        Mauricio Annett Gula, MD [facility] Pager [phone]  If 7PM-7AM, please contact night-coverage [email or web address] Password [facility] 01/07/2017, 10:43 AM

## 2017-01-07 NOTE — Consult Note (Signed)
Consultation Note Date: 01/07/2017   [name]  DOB: 05-Dec-1945  MRN: [id number]  Age / Sex: 71 y.o., female  PCP: Raelene Bott, MD Referring Physician: Tawni Millers  Reason for Consultation: Establishing goals of care  HPI/Patient Profile: 71 y.o. female  with past medical history of arthritis, asthma, degenerative disc disease, hepatitis, COPD, chronic low back pain, failure to thrive admitted on 01/03/2017 with generalized low back pain, left leg pain s/p fall at home, weakness. Further workup also revealed sepsis r/t UTI, new onset A. fib  Clinical Assessment and Goals of Care: Per Chart Review:   08/2016- Admitted to [facility]. Diff Colitis, necrotizing pneumonia Met with patient. Attempted to contact her daughter as well- left message requesting return call.  Introduced palliative medicine. Palliative medicine is specialized medical care for people living with serious illness. It focuses on providing relief from the symptoms and stress of a serious illness. The goal is to improve quality of life for both the patient and the family.  Prior to admission patient was living independently in her home for one week. She had just been d/c'd home from SNF. While there she was under Hospice care for FTT but was released due to improvement in her status. She c/o bilateral hip pain (relieved with her oxycodone, but does not last long enough), decreased appetite. She has been taking oxycodone regularly for quite some time for pancreatitis  and chronic low back pain. She denies depression. She tells me she derives joy from eating. Her main [place] would be to be able to walk and to enjoy eating again. She feels her time at the rehab and SNF helped her greatly and she would like to return to a rehab facility if possible.   She has had COPD, but has not required oxygen support at home. She is  not limited in her ADL's due to COPD.   Discussed code status. She has had a DNR order in the past but she decided to revoke this. She would not want to be on a ventilator long term, but does desire CPR short term attempt at resuscitation.    Primary Decision Maker PATIENT    SUMMARY OF RECOMMENDATIONS-  -[place]- Recommend d/c to Rehab facility when medically stable. PMT also attempting to contact patient's daughter for further GOC and clearer picture of level of functioning and history.   -Failure to thrive- she did well at nursing facility, however, her albumin is low at 1.4 indicating poor reserve- likely result of her history of pancreatitis and C.Diff. She indicated her dentures were ill fitting making it hard for her to eat. Will discuss need for dental consult with her daughter. She is currently on mirtazapine which is useful in increasing appetite. Returning to SNF may again improve this for her. She is currently on mirtazapine which is used fro increased appetite as well. This can be increased to 33m nightly if needed.  Chronic back/hip pain:  -Lidoderm patches to bilateral hips q12 hrs- leave on for 12  hrs and remove  -Opioids are appropriate to use in this patient given her hepatic and renal impaired function-   -Recommend changing oxycodone to long acting oxycontin for attempt at better pain control- 40 mg po BID (based on patient's last 24 hr dose requirements of 45m), with 112moxycodone IR Q4hr for breakthrough pain- however, will defer this until after conversation with her daughter can be had  - Patient initially declined MRI- per discussion with patient's RN she was unable to lie still due to pain in her legs- patient tells me she would agree to MRI if pain controlled. She is also confused about use of her IV for the MRI and some education related to this may help as well.      Code Status/Advance Care Planning:  Full code    Symptom Management:   As  above  Palliative Prophylaxis:   Oral Care  Additional Recommendations (Limitations, Scope, Preferences):  Full Scope Treatment  Psycho-social/Spiritual:   Desire for further Chaplaincy support:No  Additional Recommendations: Referral to Community Resources   Prognosis:    Unable to determine  Discharge Planning: To Be Determined  Primary Diagnoses: Present on Admission: . Atherosclerosis of native artery of extremity with intermittent claudication ([facility]. Acute respiratory failure ([facility]. Dehydration . Acute kidney injury ([facility]. Acidosis . Hyperkalemia . Sepsis ([facility] [place]. Anemia . Leukocytosis . Hypoglycemia . Hyponatremia . COPD (chronic obstructive pulmonary disease) ([facility]Al[facility]  I have reviewed the medical record, interviewed the patient and family, and examined the patient. The following aspects are pertinent.  Past Medical History:  Diagnosis Date  . Acidosis   . Acute kidney injury (HCA[place]  . Acute kidney injury (HCMays[place]  . Arthritis   . Asthma   . COPD (chronic obstructive pulmonary disease) (HCMadison6/13/2018  . Dehydration   . Headache(784.0)   . Heel pain   . Hepatitis   . Ulcer    Social History   Social History  . Marital status: Single    Spouse name: N/A  . Number of children: N/A  . Years of education: N/A   Social History Main Topics  . Smoking status: Current Every Day Smoker    Packs/day: 0.10    Types: Cigarettes  . Smokeless tobacco: Never Used     Comment: using Nicoderm patches  . Alcohol use No  . Drug use: No  . Sexual activity: Not Asked   Other Topics Concern  . None   Social History Narrative  . None   Family History  Problem Relation Age of Onset  . Diabetes Mother   . Arthritis Father   . Cancer Father    Scheduled Meds: . cyclobenzaprine  5 mg Oral TID  . feeding supplement (ENSURE ENLIVE)  237 mL Oral TID BM  . heparin  5,000 Units Subcutaneous Q8H  . lidocaine  2 patch Transdermal Q24H  . linaclotide  145 mcg  Oral QAC breakfast  . lipase/protease/amylase  24,000 Units Oral TID  . metoprolol tartrate  25 mg Oral BID  . mirtazapine  15 mg Oral QHS  . mometasone-formoterol  2 puff Inhalation BID  . multivitamin with minerals  1 tablet Oral Daily  . pantoprazole  40 mg Oral Daily  . polyethylene glycol  17 g Oral Daily  . senna  1 tablet Oral Daily  . venlafaxine XR  37.5 mg Oral Q breakfast   Continuous Infusions: . meropenem (MERREM) IV Stopped (01/07/17 0932)   PRN Meds:.acetaminophen **OR**  acetaminophen, albuterol, metoprolol tartrate, ondansetron **OR** ondansetron (ZOFRAN) IV, oxyCODONE, sodium chloride flush Medications Prior to Admission:  Prior to Admission medications   Medication Sig Start Date End Date Taking? Authorizing Provider  albuterol (PROVENTIL HFA;VENTOLIN HFA) 108 (90 Base) MCG/ACT inhaler Inhale 1 puff into the lungs every 6 (six) hours as needed for wheezing or shortness of breath.   Yes [provider]  budesonide-formoterol (SYMBICORT) 160-4.5 MCG/ACT inhaler Inhale 2 puffs into the lungs 2 (two) times daily.   Yes [provider]  linaclotide (LINZESS) 145 MCG CAPS capsule Take 145 mcg by mouth daily before breakfast.   Yes [provider]  loperamide (IMODIUM) 2 MG capsule Take 2 mg by mouth as needed for diarrhea or loose stools.   Yes [provider]  magnesium oxide (MAG-OX) 400 MG tablet Take 400 mg by mouth 2 (two) times daily.   Yes [provider]  mirtazapine (REMERON) 15 MG tablet Take 15 mg by mouth at bedtime.     Yes [provider]  naproxen (NAPROSYN) 500 MG tablet Take 500 mg by mouth 2 (two) times daily as needed for mild pain.   Yes [provider]  omeprazole (PRILOSEC) 20 MG capsule Take 20 mg by mouth 2 (two) times daily before a meal.   Yes [provider]  ondansetron (ZOFRAN) 4 MG tablet Take 8 mg by mouth every 8 (eight) hours as needed for nausea or vomiting.   Yes [provider]  Oxycodone HCl 20 MG TABS Take 20 mg by mouth every 4 (four) hours as needed (pain).   Yes [provider]  Pancrelipase, Lip-Prot-Amyl, (CREON) 24000-76000 units CPEP Take 1 capsule by mouth 3 (three) times daily.   Yes [provider]  Potassium 99 MG TABS Take 1 tablet by mouth daily.   Yes [provider]  spironolactone (ALDACTONE) 25 MG tablet Take 25 mg by mouth daily.   Yes [provider]  venlafaxine XR (EFFEXOR-XR) 37.5 MG 24 hr capsule Take 37.5 mg by mouth daily with breakfast.   Yes [provider]   Allergies  Allergen Reactions  . Bee Pollen Shortness Of Breath  . Morphine And Related Nausea And Vomiting  . Varenicline Other (See Comments)    Nightmares, Nausea,depressive thoughts  . Codeine Nausea Only  . Neomycin Rash   Review of Systems  Genitourinary: Negative for difficulty urinating.  Musculoskeletal: Positive for back pain and myalgias.  Neurological: Negative for dizziness.  Psychiatric/Behavioral: Negative for dysphoric mood and sleep disturbance. The patient is not nervous/anxious.     Physical Exam  Constitutional: She is oriented to person, place, and time.  Thin, frail  Cardiovascular: Normal rate and regular rhythm.   Pulmonary/Chest: Effort normal and breath sounds normal.  Abdominal: Soft. Bowel sounds are normal.  Neurological: She is alert and oriented to person, place, and time.  Skin: Skin is warm and dry.  Nursing note and vitals reviewed.   Vital Signs: BP 116/84   Pulse 89   Temp 98.5 F (36.9 C) (Oral)   Resp 18   Ht _0  (1.499 m)   Wt 42.6 kg (94 lb) Comment: do not weigh with bed in lowest position  SpO2 92%   BMI 18.99 kg/m  Pain Assessment: 0-10   Pain Score: Asleep   SpO2: SpO2: 92 % O2 Device:SpO2: 92 % O2 Flow Rate: .   IO: Intake/output summary:  Intake/Output Summary (Last 24 hours) at 01/07/17 1429 Last data filed at  01/07/17 0954  Gross per 24 hour    Intake              200 ml  Output              700 ml  Net             -500 ml    LBM: Last BM Date: 01/06/17 Baseline Weight: Weight: 36.3 kg (80 lb) (pt reported from 2 days ago ) Most recent weight: Weight: 42.6 kg (94 lb) (do not weigh with bed in lowest position)     Palliative Assessment/Data: PPS: 40%     Thank you for this consult. Palliative medicine will continue to follow and assist as needed.   Time In: 1300 Time Out:1430 Time Total: 90 minutes Greater than 50%  of this time was spent counseling and coordinating care related to the above assessment and plan.  Signed by: Mariana Kaufman, AGNP-C Palliative Medicine    Please contact Palliative Medicine Team phone at [phone] for questions and concerns.  For individual provider: See Shea Evans

## 2017-01-07 NOTE — Progress Notes (Signed)
PT Cancellation Note  Patient Details Name: Vanessa Massey MRN: [id number] DOB: [birth year]   Cancelled Treatment:    Reason Eval/Treat Not Completed: Pain limiting ability to participate;Patient declined, no reason specified. Pt reports she is in too much pain to participate in therapy. Per RN notes, pt has been refusing OOB mobility or MRI.    Colin BroachSabra M. Katina Remick PT, DPT  [phone]  01/07/2017, 3:09 PM

## 2017-01-07 NOTE — Progress Notes (Signed)
Pt has wet the bed. Pt refusing to be cleaned at this time. Will assess later.

## 2017-01-08 ENCOUNTER — Encounter (HOSPITAL_COMMUNITY): Payer: Self-pay | Admitting: Physician Assistant

## 2017-01-08 ENCOUNTER — Inpatient Hospital Stay (HOSPITAL_COMMUNITY): Payer: Medicare Other

## 2017-01-08 DIAGNOSIS — M25552 Pain in left hip: Secondary | ICD-10-CM

## 2017-01-08 DIAGNOSIS — D649 Anemia, unspecified: Secondary | ICD-10-CM

## 2017-01-08 DIAGNOSIS — R195 Other fecal abnormalities: Secondary | ICD-10-CM

## 2017-01-08 DIAGNOSIS — I4891 Unspecified atrial fibrillation: Secondary | ICD-10-CM

## 2017-01-08 DIAGNOSIS — Z7189 Other specified counseling: Secondary | ICD-10-CM

## 2017-01-08 LAB — PREPARE RBC (CROSSMATCH)

## 2017-01-08 LAB — CBC WITH DIFFERENTIAL/PLATELET
BASOS PCT: 0 %
Basophils Absolute: 0 10*3/uL (ref 0.0–0.1)
EOS ABS: 0.4 10*3/uL (ref 0.0–0.7)
Eosinophils Relative: 2 %
HCT: 21.6 % — ABNORMAL LOW (ref 36.0–46.0)
Hemoglobin: 7 g/dL — ABNORMAL LOW (ref 12.0–15.0)
LYMPHS ABS: 3.2 10*3/uL (ref 0.7–4.0)
Lymphocytes Relative: 16 %
MCH: 24.9 pg — AB (ref 26.0–34.0)
MCHC: 32.4 g/dL (ref 30.0–36.0)
MCV: 76.9 fL — ABNORMAL LOW (ref 78.0–100.0)
MONO ABS: 1.1 10*3/uL — AB (ref 0.1–1.0)
MONOS PCT: 6 %
Neutro Abs: 15.3 10*3/uL — ABNORMAL HIGH (ref 1.7–7.7)
Neutrophils Relative %: 76 %
Platelets: 178 10*3/uL (ref 150–400)
RBC: 2.81 MIL/uL — ABNORMAL LOW (ref 3.87–5.11)
RDW: 17.5 % — AB (ref 11.5–15.5)
WBC: 20.1 10*3/uL — ABNORMAL HIGH (ref 4.0–10.5)

## 2017-01-08 LAB — BASIC METABOLIC PANEL
Anion gap: 7 (ref 5–15)
BUN: 6 mg/dL (ref 6–20)
CALCIUM: 6.8 mg/dL — AB (ref 8.9–10.3)
CHLORIDE: 105 mmol/L (ref 101–111)
CO2: 21 mmol/L — ABNORMAL LOW (ref 22–32)
CREATININE: 0.52 mg/dL (ref 0.44–1.00)
GFR calc non Af Amer: >60 mL/min (ref >60)
Glucose, Bld: 105 mg/dL — ABNORMAL HIGH (ref 65–99)
Potassium: 3.6 mmol/L (ref 3.5–5.1)
Sodium: 133 mmol/L — ABNORMAL LOW (ref 135–145)

## 2017-01-08 LAB — ECHOCARDIOGRAM COMPLETE
HEIGHTINCHES: 59 in
WEIGHTICAEL: 1536 [oz_av]

## 2017-01-08 MED ORDER — SODIUM CHLORIDE 0.9 % IV SOLN: Freq: Once | INTRAVENOUS | Status: DC

## 2017-01-08 MED ORDER — MIRTAZAPINE 30 MG PO TABS
30.0000 mg | ORAL_TABLET | Freq: Every day | ORAL | Status: DC
  Administered 2017-01-08 – 2017-01-10 (×3): 30 mg via ORAL
  Filled 2017-01-08 (×3): qty 1

## 2017-01-08 MED ORDER — LORAZEPAM 2 MG/ML IJ SOLN
0.5000 mg | Freq: Once | INTRAMUSCULAR | Status: AC
  Administered 2017-01-08: 0.5 mg via INTRAVENOUS
  Filled 2017-01-08: qty 1

## 2017-01-08 MED ORDER — OXYCODONE HCL 5 MG PO TABS
10.0000 mg | ORAL_TABLET | ORAL | Status: DC | PRN
  Administered 2017-01-09 – 2017-01-11 (×7): 10 mg via ORAL
  Filled 2017-01-08 (×7): qty 2

## 2017-01-08 MED ORDER — OXYCODONE HCL 5 MG PO TABS: 15.0000 mg | ORAL_TABLET | ORAL | Status: DC | PRN

## 2017-01-08 MED ORDER — PANCRELIPASE (LIP-PROT-AMYL) 12000-38000 UNITS PO CPEP
24000.0000 [IU] | ORAL_CAPSULE | Freq: Three times a day (TID) | ORAL | Status: DC
  Administered 2017-01-08: 24000 [IU] via ORAL
  Filled 2017-01-08: qty 2

## 2017-01-08 MED ORDER — PANCRELIPASE (LIP-PROT-AMYL) 36000-114000 UNITS PO CPEP
36000.0000 [IU] | ORAL_CAPSULE | Freq: Three times a day (TID) | ORAL | Status: DC
  Administered 2017-01-09 – 2017-01-11 (×7): 36000 [IU] via ORAL
  Filled 2017-01-08 (×2): qty 1
  Filled 2017-01-08: qty 3
  Filled 2017-01-08 (×2): qty 1
  Filled 2017-01-08: qty 3
  Filled 2017-01-08: qty 1
  Filled 2017-01-08: qty 3
  Filled 2017-01-08 (×3): qty 1

## 2017-01-08 MED ORDER — OXYCODONE HCL ER 10 MG PO T12A
40.0000 mg | EXTENDED_RELEASE_TABLET | Freq: Two times a day (BID) | ORAL | Status: DC
  Administered 2017-01-08 – 2017-01-11 (×7): 40 mg via ORAL
  Filled 2017-01-08 (×7): qty 4

## 2017-01-08 NOTE — Progress Notes (Signed)
PROGRESS NOTE   Vanessa MakiJudy F Massey  YNW:[id number]    DOB: 06-27-1946    DOA: 01/03/2017  PCP: Lindwood QuaHoffman, Byron, MD   I have briefly reviewed patients previous medical [facility] in [facility].  Brief Narrative:  71 year old female, lives alone, ambulates with the help of a walker, PMH of asthma/COPD not on home oxygen, told to have hepatitis at age 71, status post partial gastrectomy and pancreas surgery, depression, chronic low back pain, sustained a mechanical fall 2 days prior to admission when she was ambulating to the bathroom without her walker, denies any injuries, bleeding or LOC, able to get up by herself, presents with worsening low back pain, left hip pain. She had been seen at [facility] but no imaging studies performed. Home oxycodone did not provide relief. Also reported weakness but no dizziness or lightheadedness. No dysuria, urinary frequency, fever or chills. 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 Admitted for further management. New onset A. fib with RVR on 6/14.Confirmed ESBL Escherichia coli UTI. PAF with CVR. Ongoing left hip pain >Unable to do MRI hip on 6/16. As per daughter, beginning of this year was in SNF for 3 months, then discharged to patient's brother's home, was on home hospice-able to come off, and returned home a week ago. Palliative care consulted for goals of care. Left hip pain better. Trying MRI of left hip today. FOBT +. Hemoglobin again has gradually dropped down to 7. [facility] consulted.   Assessment & Plan:   Principal Problem:   Acute respiratory failure (HCC) Active Problems:   Atherosclerosis of native artery of extremity with intermittent claudication (HCC)   Acidosis   Acute kidney injury (HCC)   Dehydration   Hyperkalemia   Sepsis (HCC)   Anemia   Leukocytosis   Hypoglycemia  Hyponatremia   COPD (chronic obstructive pulmonary disease) (HCC)   Fall   AKI (acute kidney injury) (HCC)   Acute pyelonephritis   Protein-calorie malnutrition, severe   Palliative care by specialist   Advance care planning   Goals of care, counseling/discussion   1. ESBL Escherichia coli UTI: Patient denies urinary symptoms but urine microscopy and emphysematous cystitis seen on lumbar spine x-ray are concerning. No sepsis on admission. Empirically had been started on IV ceftriaxone on admission. Culture sensitivity results confirm ESBL Escherichia coli and hence IV ceftriaxone changed to IV meropenem on 6/15. Had fever of 100.7 on 5/16. No change in treatment. Day 4 IV meropenem. 2. A. fib with RVR, new onset: Now PAF w CVR. Continue metoprolol 25 MG PO BID. Check 2-D echo-done and report pending.. TSH: 0.598/normal. Poor candidate for long-term anticoagulation given extreme frailness, history of falls and fall risk, severe anemia. Anticoagulation decision can be revisited in the future if her condition changes/improves. Has intermittent A. fib with mild RVR. May consider increasing metoprolol as BP tolerates. RVR may also be precipitated by anemia. 3. Transient hypoxia/acute respiratory failure with hypoxia: She was 86% on room air in the ED. May have been related to acute illness complicating COPD. No clinical bronchospasm. Chest x-ray without acute findings. Resolved. Stable. 4. COPD: When necessary broncho-dilator nebulizations. No clinical bronchospasm. Stable. 5. Acute kidney injury: Resolved. 6. Hyperkalemia/hypokalemia: Hyperkalemia resolved. Aldactone on hold. Replaced 7. Non-anion gap metabolic acidosis: Unclear etiology. No GI losses reported.? RTA 4-? Etiology. She had bicarbonate of 10 on admission. Resolved after IV bicarbonate  drip.  8. Dehydration with hyponatremia: Resolved. At high risk for recurrent dehydration due to inconsistent oral intake. 9. Chronic microcytic anemia:  Presented with hemoglobin of 8.3 which dropped to 6.6 after hydration. Anemia panel reviewed,? Iron deficiency versus chronic disease. Denies history of GI bleed symptoms. States that she had a colonoscopy 12 years ago and reportedly normal. Baseline hemoglobin in the 8-9 range. Hemoglobin up from 6.6 > 8.3 after 1 unit PRBC 6/14. Hemoglobin is again drifted down to 7 on 6/18. FOBT +. [facility] consulted but at this time do not believe that patient can lie down for colonoscopy. Palliative assisting with pain control. 10. Hypoglycemia: Probably due to poor oral intake. Serum glucose on presentation <20. Monitor CBGs closely. Resolved. 11. Elevated INR/abnormal LFTs/hepatitis history:? Cirrhosis. Acute hepatitis panel: Negative and HIV status.: Nonreactive.? Nutritional. INR down to 1.34. 12. Leukocytosis:? Related to acute infectious etiology versus others. Review of labs in care everywhere shows that she has chronic leukocytosis at least since 2015 ranging between tens and 40, etiology of this is unclear. Check peripheral smear. Trend CBCs for improvement and may need to consider outpatient hematology consultation. Leukocytosis continues to improve 36.3 > 22.5 >17.6 >20.1. Continue to follow CBCs. 13. Severe protein calorie malnutrition: Dietitian consultation. 14. Low back and left hip pain: X-rays negative for fractures. PT evaluation. Pain management. Patient reports chronic low back pain for 8 years and has been on opioids since then. Ongoing hip pain. Refused to get out of bed. Added Flexeril and K pad. Pain is better controlled. Trial of MRI left hip. 15. Adult failure to thrive: Probably multifactorial related to age and multiple severe comorbidities. Palliative care consultation and follow-up appreciated. 16. Depression: Continue venlafaxine. No suicidal or homicidal ideations reported. 17. Severe weight loss: Reported by patient's daughter on 6/16. Apparently was released from hospice. Etiology  felt to be due to poor oral intake. Outpatient evaluation as deemed necessary. 18. Chronic pancreatitis: Based on pancreatic supplements PTA. Could also because of severe weight loss. No diarrhea. GI increasing Creon.   DVT prophylaxis: Subcutaneous heparin Code Status: Full Family Communication: None at bedside. Disposition: To be determined, possibly SNF.   Consultants:  Palliative care team  Procedures:  LUE PICC line.   Antimicrobials:  IV ceftriaxone -discontinued. IV meropenem.   Subjective: States that her left hip pain is better and is willing to try MRI hip again. Denies any other complaints.  ROS: No chest pain, dyspnea, dizziness or lightheadedness.   Objective:  Vitals:   01/08/17 0500 01/08/17 0545 01/08/17 0938 01/08/17 1009  BP:  115/60  125/65  Pulse:  89  85  Resp:  17    Temp:  98 F (36.7 C)    TempSrc:  Oral    SpO2:  94% 92%   Weight: 43.5 kg (96 lb)     Height:        Examination:  General exam: Small built, frail, chronically ill-looking middle-aged female lying comfortably supine in bed  Respiratory system: clear to auscultation without increased work of breathing  Cardiovascular system: S1 and S2 heard, RRR. No JVD, murmurs or pedal edema. Telemetry: PAF with intermittent mild RVR. Gastrointestinal system: Abdomen is nondistended, soft and nontender. No organomegaly or masses felt. Normal bowel sounds heard. No change. Central nervous system: Alert and oriented. No focal neurological deficits. Extremities: Symmetric 5 x 5 power. No bruising over left hip or lower back. Painful range of movements left hip. Skin: No rashes, lesions or ulcers Psychiatry: Judgement  and insight appear normal. Mood & affect flat.    Data Reviewed: I have personally reviewed following labs and imaging studies  CBC:  Recent Labs Lab 01/03/17 1300 01/04/17 0510 01/05/17 0425 01/06/17 0337 01/07/17 0421 01/08/17 0411  WBC 36.9* 36.3* 22.5* 17.6* 17.5*  20.1*  NEUTROABS 33.6*  --  19.0* 14.0* 13.1* 15.3*  HGB 8.3* 6.6* 8.3* 7.7* 7.6* 7.0*  HCT 26.2* 20.2* 24.8* 23.5* 23.3* 21.6*  MCV 75.1* 73.5* 74.0* 75.1* 76.4* 76.9*  PLT 456* 376 309 175 148* 178   Basic Metabolic Panel:  Recent Labs Lab 01/04/17 2357 01/05/17 0425 01/06/17 0337 01/07/17 0421 01/08/17 0411  NA 134* 135 135 134* 133*  K 3.4* 5.1 3.3* 3.7 3.6  CL 109 110 105 104 105  CO2 15* 16* 25 25 21*  GLUCOSE 93 85 119* 108* 105*  BUN 26* 22* 12 8 6   CREATININE 1.09* 1.01* 0.62 0.52 0.52  CALCIUM 7.3* 7.4* 7.1* 7.1* 6.8*   Liver Function Tests:  Recent Labs Lab 01/04/17 0805 01/06/17 0337  AST 176* 84*  ALT 75* 50  ALKPHOS 342* 258*  BILITOT 2.0* 0.7  PROT 4.5* 4.2*  ALBUMIN 1.5* 1.4*   Coagulation Profile:  Recent Labs Lab 01/03/17 1730 01/06/17 0337  INR 1.74 1.34   Cardiac Enzymes:  Recent Labs Lab 01/03/17 1053  CKTOTAL 135   HbA1C: No results for input(s): HGBA1C in the last 72 hours. CBG:  Recent Labs Lab 01/04/17 1634 01/04/17 2242 01/05/17 0751 01/05/17 1147 01/05/17 1716  GLUCAP 109* 91 76 82 90    Recent Results (from the past 240 hour(s))  Urine culture     Status: Abnormal   Collection Time: 01/03/17  3:45 PM  Result Value Ref Range Status   Specimen Description URINE, CATHETERIZED  Final   Special Requests NONE  Final   Culture (A)  Final    >=100,000 COLONIES/mL ESCHERICHIA COLI Confirmed Extended Spectrum Beta-Lactamase Producer (ESBL)    Report Status 01/05/2017 FINAL  Final   Organism ID, Bacteria ESCHERICHIA COLI (A)  Final      Susceptibility   Escherichia coli - MIC*    AMPICILLIN >=32 RESISTANT Resistant     CEFAZOLIN >=64 RESISTANT Resistant     CEFTRIAXONE >=64 RESISTANT Resistant     CIPROFLOXACIN >=4 RESISTANT Resistant     GENTAMICIN <=1 SENSITIVE Sensitive     IMIPENEM <=0.25 SENSITIVE Sensitive     NITROFURANTOIN >=512 RESISTANT Resistant     TRIMETH/SULFA <=20 SENSITIVE Sensitive      AMPICILLIN/SULBACTAM >=32 RESISTANT Resistant     PIP/TAZO <=4 SENSITIVE Sensitive     Extended ESBL POSITIVE Resistant     * >=100,000 COLONIES/mL ESCHERICHIA COLI  MRSA PCR Screening     Status: Abnormal   Collection Time: 01/07/17  9:08 PM  Result Value Ref Range Status   MRSA by PCR POSITIVE (A) NEGATIVE Final    Comment:        The GeneXpert MRSA Assay (FDA approved for NASAL specimens only), is one component of a comprehensive MRSA colonization surveillance program. It is not intended to diagnose MRSA infection nor to guide or monitor treatment for MRSA infections. RESULT CALLED TO, READ BACK BY AND VERIFIED WITH: Meda Klinefelter, RN 2252 01/07/17 T. TYSOR          Radiology Studies: No results found.      Scheduled Meds: . cyclobenzaprine  5 mg Oral TID  . feeding supplement (ENSURE ENLIVE)  237 mL Oral TID BM  .  heparin  5,000 Units Subcutaneous Q8H  . lidocaine  2 patch Transdermal Daily  . linaclotide  145 mcg Oral QAC breakfast  . lipase/protease/amylase  36,000 Units Oral TID WC  . LORazepam  0.5 mg Intravenous Once  . metoprolol tartrate  25 mg Oral BID  . mirtazapine  30 mg Oral QHS  . mometasone-formoterol  2 puff Inhalation BID  . multivitamin with minerals  1 tablet Oral Daily  . oxyCODONE  40 mg Oral Q12H  . pantoprazole  40 mg Oral Daily  . polyethylene glycol  17 g Oral Daily  . senna  1 tablet Oral Daily  . venlafaxine XR  37.5 mg Oral Q breakfast   Continuous Infusions: . sodium chloride    . meropenem (MERREM) IV Stopped (01/08/17 1051)     LOS: 5 days     Maryn Freelove, MD, FACP, FHM. [facility] Pager [phone]  If 7PM-7AM, please contact night-coverage [email or web address] Password [facility] 01/08/2017, 3:37 PM

## 2017-01-08 NOTE — Progress Notes (Signed)
  Echocardiogram 2D Echocardiogram has been performed.  Arvil ChacoFoster, Thressa Shiffer 01/08/2017, 3:53 PM

## 2017-01-08 NOTE — Progress Notes (Signed)
Pharmacy Antibiotic Note  Ann MakiJudy F Howington is a 71 y.o. female admitted on 01/03/2017 with ESBL UTI.  Pharmacy has been consulted for meropenem dosing. Today is day #4 of therapy.  Plan: Meropenem 1g IV q12h Recommend 7 days of treatment Consider switch to Bactrim if oral appropriate Follow LOT and renal function  Height: 4\' 11"  (149.9 cm) Weight: 96 lb (43.5 kg) IBW/kg (Calculated) : 43.2  Temp (24hrs), Avg:98.3 F (36.8 C), Min:98 F (36.7 C), Max:98.6 F (37 C)   Recent Labs Lab 01/03/17 1351 01/03/17 1800  01/04/17 0510 01/04/17 2357 01/05/17 0425 01/06/17 0337 01/07/17 0421 01/08/17 0411  WBC  --   --   --  36.3*  --  22.5* 17.6* 17.5* 20.1*  CREATININE  --   --   < >  --  1.09* 1.01* 0.62 0.52 0.52  LATICACIDVEN 2.23* 1.7  --   --   --   --   --   --   --   < > = values in this interval not displayed.  Estimated Creatinine Clearance: 45.3 mL/min (by C-G formula based on SCr of 0.52 mg/dL).    Allergies  Allergen Reactions  . Bee Pollen Shortness Of Breath  . Morphine And Related Nausea And Vomiting  . Varenicline Other (See Comments)    Nightmares, Nausea,depressive thoughts  . Codeine Nausea Only  . Neomycin Rash    Antimicrobials this admission: Vanc 6/13 x1 CTX 6/13>>6/15 Meropenem 6/15>>  Microbiology results:  patient refused blood cxs  6/13 urine: ESBL EColi - sens to Bactrim, carbapenems   Loura BackJennifer Fults, PharmD, BCPS Clinical Pharmacist Phone for today [phone] Main pharmacy - [phone] 01/08/2017 2:30 PM

## 2017-01-08 NOTE — NC FL2 (Signed)
[place] MEDICAID FL2 LEVEL OF CARE SCREENING TOOL     IDENTIFICATION  [name] Birthdate: [birth year] Sex: female Admission Date (Current Location): 01/03/2017  [facility] and [place] Number:  [employer] and Address:  [facility], [address] [place], [place] [address]      Provider Number: [id number]  Attending Physician Name and Address:  Elease Etienne, MD  Relative Name and Phone Number:       Current Level of Care: Hospital Recommended Level of Care: Skilled Nursing Facility Prior Approval Number:    Date Approved/Denied:   PASRR Number: [id number]  Discharge Plan: SNF    Current Diagnoses: Patient Active Problem List   Diagnosis Date Noted  . Goals of care, counseling/discussion   . Palliative care by specialist   . Advance care planning   . Protein-calorie malnutrition, severe 01/04/2017  . Acute respiratory failure (HCC) 01/03/2017  . Hyperkalemia 01/03/2017  . Sepsis (HCC) 01/03/2017  . Anemia 01/03/2017  . Leukocytosis 01/03/2017  . Hypoglycemia 01/03/2017  . Hyponatremia 01/03/2017  . COPD (chronic obstructive pulmonary disease) (HCC) 01/03/2017  . Hepatitis   . Acidosis   . Acute kidney injury (HCC)   . Dehydration   . Fall   . AKI (acute kidney injury) (HCC)   . Acute pyelonephritis   . Atherosclerosis of native artery of extremity with intermittent claudication (HCC) 06/07/2011    Orientation RESPIRATION BLADDER Height & Weight     Self  Normal Incontinent, External catheter Weight: 96 lb (43.5 kg) Height:  4\' 11"  (149.9 cm)  BEHAVIORAL SYMPTOMS/MOOD NEUROLOGICAL BOWEL NUTRITION STATUS      Incontinent Diet (regular)  AMBULATORY STATUS COMMUNICATION OF NEEDS Skin   Extensive Assist Verbally Normal                       Personal Care Assistance Level of Assistance  Bathing, Dressing Bathing Assistance: Maximum assistance   Dressing Assistance: Maximum assistance      Functional Limitations Info             SPECIAL CARE FACTORS FREQUENCY  PT (By licensed PT), OT (By licensed OT)     PT Frequency: 5/wk OT Frequency: 5/wk            Contractures      Additional Factors Info  Code Status, Allergies Code Status Info: DNR Allergies Info: Bee Pollen, Morphine And Related, Varenicline, Codeine, Neomycin           Current Medications (01/08/2017):  This is the current hospital active medication list Current Facility-Administered Medications  Medication Dose Route Frequency Provider Last Rate Last Dose  . 0.9 %  sodium chloride infusion   Intravenous Once Marcellus Scott D, MD      . acetaminophen (TYLENOL) tablet 650 mg  650 mg Oral Q6H PRN Elease Etienne, MD   650 mg at 01/08/17 1238   Or  . acetaminophen (TYLENOL) suppository 650 mg  650 mg Rectal Q6H PRN Hongalgi, Anand D, MD      . albuterol (PROVENTIL) (2.5 MG/3ML) 0.083% nebulizer solution 3 mL  3 mL Inhalation Q6H PRN Hongalgi, Anand D, MD      . cyclobenzaprine (FLEXERIL) tablet 5 mg  5 mg Oral TID Elease Etienne, MD   5 mg at 01/08/17 1009  . feeding supplement (ENSURE ENLIVE) (ENSURE ENLIVE) liquid 237 mL  237 mL Oral TID BM Hongalgi, Maximino Greenland, MD   237  mL at 01/08/17 1015  . heparin injection 5,000 Units  5,000 Units Subcutaneous Q8H Elease EtienneHongalgi, Anand D, MD   5,000 Units at 01/08/17 1340  . lidocaine (LIDODERM) 5 % 2 patch  2 patch Transdermal Daily Barbara CowerMahan, Kasie J, NP   2 patch at 01/08/17 1005  . linaclotide (LINZESS) capsule 145 mcg  145 mcg Oral QAC breakfast Elease EtienneHongalgi, Anand D, MD   145 mcg at 01/07/17 0849  . lipase/protease/amylase (CREON) capsule 36,000 Units  36,000 Units Oral TID WC Dianah FieldGribbin, Sarah J, PA-C      . LORazepam (ATIVAN) injection 0.5 mg  0.5 mg Intravenous Once Hongalgi, Anand D, MD      . meropenem (MERREM) 1 g in sodium chloride 0.9 % 100 mL IVPB  1 g Intravenous Q12H Bajbus, Lauren D, RPH   Stopped at 01/08/17 1051  . metoprolol tartrate (LOPRESSOR)  injection 2.5-5 mg  2.5-5 mg Intravenous Q6H PRN Hongalgi, Anand D, MD      . metoprolol tartrate (LOPRESSOR) tablet 25 mg  25 mg Oral BID Elease EtienneHongalgi, Anand D, MD   25 mg at 01/08/17 1009  . mirtazapine (REMERON) tablet 30 mg  30 mg Oral QHS Mahan, Kasie J, NP      . mometasone-formoterol (DULERA) 200-5 MCG/ACT inhaler 2 puff  2 puff Inhalation BID Elease EtienneHongalgi, Anand D, MD   2 puff at 01/08/17 [phone]  . multivitamin with minerals tablet 1 tablet  1 tablet Oral Daily Elease EtienneHongalgi, Anand D, MD   1 tablet at 01/08/17 1009  . ondansetron (ZOFRAN) tablet 4 mg  4 mg Oral Q6H PRN Hongalgi, Maximino GreenlandAnand D, MD       Or  . ondansetron (ZOFRAN) injection 4 mg  4 mg Intravenous Q6H PRN Hongalgi, Anand D, MD      . oxyCODONE (Oxy IR/ROXICODONE) immediate release tablet 10 mg  10 mg Oral Q3H PRN Barbara CowerMahan, Kasie J, NP      . oxyCODONE (OXYCONTIN) 12 hr tablet 40 mg  40 mg Oral Q12H Barbara CowerMahan, Kasie J, NP   40 mg at 01/08/17 1340  . pantoprazole (PROTONIX) EC tablet 40 mg  40 mg Oral Daily Elease EtienneHongalgi, Anand D, MD   40 mg at 01/08/17 1009  . polyethylene glycol (MIRALAX / GLYCOLAX) packet 17 g  17 g Oral Daily Hongalgi, Maximino GreenlandAnand D, MD      . senna (SENOKOT) tablet 8.6 mg  1 tablet Oral Daily Hongalgi, Anand D, MD      . sodium chloride flush (NS) 0.9 % injection 10-40 mL  10-40 mL Intracatheter PRN Elease EtienneHongalgi, Anand D, MD   10 mL at 01/06/17 2316  . venlafaxine XR (EFFEXOR-XR) 24 hr capsule 37.5 mg  37.5 mg Oral Q breakfast Elease EtienneHongalgi, Anand D, MD   37.5 mg at 01/08/17 1009     Discharge Medications: Please see discharge summary for a list of discharge medications.  Relevant Imaging Results:  Relevant Lab Results:   Additional Information SS#: [id number]  Burna SisUris, Yousif Edelson H, LCSW

## 2017-01-08 NOTE — Evaluation (Signed)
Physical Therapy Evaluation Patient Details Name: Vanessa Massey MRN: [id number] DOB: 1946-03-29 Today's Date: 01/08/2017   History of Present Illness  Vanessa Massey is a 71 y.o. female with medical history significant for arthritis, asthma, degenerative disc disease hepatitis presents to the emergency department. Chief complaint generalized weakness persistent low back pain and left leg pain. Initial evaluation reveals a lactic acidosis, acute kidney injury, hyperkalemia and leukocytosis and hypoglycemia.  Pt with mechanical fall 2 days prior to admission.   Clinical Impression  Pt admitted with above diagnosis. Pt currently with functional limitations due to the deficits listed below (see PT Problem List).  Pt will benefit from skilled PT to increase their independence and safety with mobility to allow discharge to the venue listed below.  Pt with back and hip pain limiting mobility.  Pt gave pain meds at beginning of session and applied patches to hips.  Pt needed +2 for safety as pt unsafe at times due to pain.  Recommend SNF and pt in agreement.     Follow Up Recommendations SNF;Supervision/Assistance - 24 hour    Equipment Recommendations  None recommended by PT    Recommendations for Other Services       Precautions / Restrictions Precautions Precautions: Fall Restrictions Weight Bearing Restrictions: No      Mobility  Bed Mobility Overal bed mobility: Needs Assistance Bed Mobility: Rolling;Sidelying to Sit Rolling: Min assist Sidelying to sit: Max assist       General bed mobility comments: Heavy use of bed pad to get hips to EOB and then MAX for trunk, Once up, pt started to fling herself backwards, but was able to get her to EOB and calmed down.  Transfers Overall transfer level: Needs assistance Equipment used: Rolling walker (2 wheeled) Transfers: Sit to/from [employer] Sit to Stand: Max assist Stand pivot transfers: Max assist;+2  safety/equipment       General transfer comment: MAX A with poor safety and needing heavy cueing.  Pt took a few steps and then recliner brought behind her.  Ambulation/Gait             General Gait Details: deferred  Stairs            Wheelchair Mobility    Modified Rankin (Stroke Patients Only)       Balance Overall balance assessment: History of Falls;Needs assistance   Sitting balance-Leahy Scale: Poor       Standing balance-Leahy Scale: Zero Standing balance comment: due to pain                             Pertinent Vitals/Pain Pain Assessment: 0-10 Pain Score: 10-Worst pain ever Pain Location: both hips and back Pain Descriptors / Indicators: Other (Comment) (gnawing) Pain Intervention(s): Patient requesting pain meds-RN notified    Home Living Family/patient expects to be discharged to:: Skilled nursing facility                 Additional Comments: Per chart, pt had been at SNF this Spring and even on hospice, but had made improvement    Prior Function Level of Independence: Independent with assistive device(s)         Comments: AMb with RW with recent fall since being home from SNF.     Hand Dominance        Extremity/Trunk Assessment        Lower Extremity Assessment Lower Extremity Assessment: Generalized weakness;RLE deficits/detail;LLE deficits/detail  RLE Deficits / Details: limited ROM due to pain RLE: Unable to fully assess due to pain LLE Deficits / Details: limited ROM due to pain LLE: Unable to fully assess due to pain       Communication      Cognition Arousal/Alertness: Awake/alert Behavior During Therapy: WFL for tasks assessed/performed Overall Cognitive Status: Within Functional Limits for tasks assessed                                        General Comments      Exercises     Assessment/Plan    PT Assessment Patient needs continued PT services  PT Problem List  Decreased strength;Decreased range of motion;Decreased activity tolerance;Decreased balance;Decreased mobility;Pain       PT Treatment Interventions DME instruction;Gait training;Functional mobility training;Therapeutic exercise;Therapeutic activities    PT Goals (Current goals can be found in the Care Plan section)  Acute Rehab PT Goals Patient Stated Goal: to go to SNF PT Goal Formulation: With patient Time For Goal Achievement: 01/22/17 Potential to Achieve Goals: Fair    Frequency Min 2X/week   Barriers to discharge Decreased caregiver support      Co-evaluation               AM-PAC PT "6 Clicks" Daily Activity  Outcome Measure Difficulty turning over in bed (including adjusting bedclothes, sheets and blankets)?: Total Difficulty moving from lying on back to sitting on the side of the bed? : Total Difficulty sitting down on and standing up from a chair with arms (e.g., wheelchair, bedside commode, etc,.)?: Total Help needed moving to and from a bed to chair (including a wheelchair)?: A Lot Help needed walking in hospital room?: A Lot Help needed climbing 3-5 steps with a railing? : Total 6 Click Score: 8    End of Session Equipment Utilized During Treatment: Gait belt Activity Tolerance: Patient limited by pain Patient left: in chair;with call bell/phone within reach;with chair alarm set Nurse Communication: Mobility status (nurse present and A with transfer) PT Visit Diagnosis: Unsteadiness on feet (R26.81);Muscle weakness (generalized) (M62.81);Difficulty in walking, not elsewhere classified (R26.2)    Time: 0950-1030 PT Time Calculation (min) (ACUTE ONLY): 40 min   Charges:   PT Evaluation $PT Eval Moderate Complexity: 1 Procedure PT Treatments $Therapeutic Activity: 23-37 mins   PT G Codes:        Toba Claudio L. Katrinka BlazingSmith, [place]PT Pager [phone] 01/08/2017   Enzo MontgomeryKaren L Mycah Formica 01/08/2017, 10:45 AM

## 2017-01-08 NOTE — Progress Notes (Signed)
Patient sleeping soundly. Removed foam and coban from lower right arm. Abrasion intact. Will continue to monitor.

## 2017-01-08 NOTE — Consult Note (Signed)
Gastroenterology Consult: 1:40 PM 01/08/2017  LOS: 5 days    Referring Provider: Dr Waymon Amato  Primary Care Physician:  Lindwood Qua, MD in [facility] Primary Gastroenterologist: unassigned    Reason for Consultation:  FOBT + anemia.  FTT   HPI: Vanessa Massey is a 71 y.o. female.  PMH asthma, COPD.  Arthritis, DDD.  Chronic pain (Naprosyn, Oxycodone at home). Hepatitis at age 89. Ulcer. ASPVD. Atrial fibrillation  Chronic pancreatitis with chronic pain on Creon (per Care Everywhere).  Anemia.  Fatty liver. S/p cholecystectomy 1970s.  S/p colon surgery.  S/p partial gastrectomy, gastrojejunostomy, and pancreatic surgery: Whipples procedure 1999. Osteoporosis.   In 05/2016 hospitalized at [facility] with necrotizing PNA,  she had the same in the summer of 2017. Admission to [facility] 08/25/2016 - 09/10/16 with diffuse colitis (infectious vs inflammatory. Ischemic colitis would be less likely given the distribution).  C diff positive, eventually settled on treatment with oral vanc.  Severe protein malnutrition.  FTT.  33.5KG at discharge. Note Linzess had been in use prior to that 08/2016 admission.   Discharged to SNF for 3 months and discharged to home hospice about 2 weeks ago.    Presented to ED 5 days ago with pain, respiratory compromise after fall 2 days PTA.  + lactic acidosis, urinary sepsis, e coli UTI.   PAF.  AKI.  Anemia. Hgb 9.9 >> 6.6.  S/p PRBC x 1 >> 8.3 >> 7.6 >> 7.0.  Microcytic and low iron and TIBC.  FOBT + but no overt melena, BPR.   Hgb in 08/2016 was 8.6.   She does not recall any previous upper endoscopy or colonoscopy. She had never received blood transfusions before this current admission. Stools currently loose about 1 per day, she is receiving Linzess once daily..  She describes oily  substance floating on toilet water after a BM, takes 1 Creon with her meals.  She is now receiving blind or melenic stool. She has occasional upper abdominal pain, occasional nausea. No vomiting. Occasional pill and solid food dysphagia.    Patient doesn't consume liquor. She quit all alcohol in 1991 when she devoted her life to [place].  She says that before that she did use illicit drugs but has previously tested negative for HIV and hepatitis B and C.  Care everywhere confirms that she was Hep A/B/C and HIV negative 12/2015.  She is consistently within, at best in the years following her pancreatitis she weighed 100#, with recent hospitalization her weight dropped to 72#, currently she weighs in at 96#.    I asked her if she would be able to undergo colonoscopy and upper endoscopy.  Because she is having intense left hip pain she doesn't think that she can go through a colonoscopy as it hurts to move around and to lay on either side. She would like to get an MRI of her hip to determine what is going on before she consents to any endoscopic procedure.   Past Medical History:  Diagnosis Date  . Acidosis   .  Acute kidney injury (HCC)   . Acute kidney injury (HCC)   . Arthritis   . Asthma   . COPD (chronic obstructive pulmonary disease) (HCC) 01/03/2017  . Dehydration   . Headache(784.0)   . Heel pain   . Hepatitis   . Ulcer     Past Surgical History:  Procedure Laterality Date  . CHOLECYSTECTOMY  1971  . COLON SURGERY    . PANCREAS SURGERY  1999  . PARTIAL GASTRECTOMY  1999    Prior to Admission medications   Medication Sig Start Date End Date Taking? Authorizing Provider  albuterol (PROVENTIL HFA;VENTOLIN HFA) 108 (90 Base) MCG/ACT inhaler Inhale 1 puff into the lungs every 6 (six) hours as needed for wheezing or shortness of breath.   Yes [provider]  budesonide-formoterol (SYMBICORT) 160-4.5 MCG/ACT inhaler Inhale 2 puffs into the lungs 2 (two) times daily.   Yes  [provider]  linaclotide (LINZESS) 145 MCG CAPS capsule Take 145 mcg by mouth daily before breakfast.   Yes [provider]  loperamide (IMODIUM) 2 MG capsule Take 2 mg by mouth as needed for diarrhea or loose stools.   Yes [provider]  magnesium oxide (MAG-OX) 400 MG tablet Take 400 mg by mouth 2 (two) times daily.   Yes [provider]  mirtazapine (REMERON) 15 MG tablet Take 15 mg by mouth at bedtime.     Yes [provider]  naproxen (NAPROSYN) 500 MG tablet Take 500 mg by mouth 2 (two) times daily as needed for mild pain.   Yes [provider]  omeprazole (PRILOSEC) 20 MG capsule Take 20 mg by mouth 2 (two) times daily before a meal.   Yes [provider]  ondansetron (ZOFRAN) 4 MG tablet Take 8 mg by mouth every 8 (eight) hours as needed for nausea or vomiting.   Yes [provider]  Oxycodone HCl 20 MG TABS Take 20 mg by mouth every 4 (four) hours as needed (pain).   Yes [provider]  Pancrelipase, Lip-Prot-Amyl, (CREON) 24000-76000 units CPEP Take 1 capsule by mouth 3 (three) times daily.   Yes [provider]  Potassium 99 MG TABS Take 1 tablet by mouth daily.   Yes [provider]  spironolactone (ALDACTONE) 25 MG tablet Take 25 mg by mouth daily.   Yes [provider]  venlafaxine XR (EFFEXOR-XR) 37.5 MG 24 hr capsule Take 37.5 mg by mouth daily with breakfast.   Yes [provider]    Scheduled Meds: . cyclobenzaprine  5 mg Oral TID  . feeding supplement (ENSURE ENLIVE)  237 mL Oral TID BM  . heparin  5,000 Units Subcutaneous Q8H  . lidocaine  2 patch Transdermal Daily  . linaclotide  145 mcg Oral QAC breakfast  . lipase/protease/amylase  24,000 Units Oral TID WC  . metoprolol tartrate  25 mg Oral BID  . mirtazapine  30 mg Oral QHS  . mometasone-formoterol  2 puff Inhalation BID  . multivitamin with minerals  1 tablet Oral Daily  . oxyCODONE  40 mg Oral  Q12H  . pantoprazole  40 mg Oral Daily  . polyethylene glycol  17 g Oral Daily  . senna  1 tablet Oral Daily  . venlafaxine XR  37.5 mg Oral Q breakfast   Infusions: . meropenem (MERREM) IV Stopped (01/08/17 1051)   PRN Meds: acetaminophen **OR** acetaminophen, albuterol, metoprolol tartrate, ondansetron **OR** ondansetron (ZOFRAN) IV, oxyCODONE, sodium chloride flush   Allergies as of 01/03/2017 -  Review Complete 01/03/2017  Allergen Reaction Noted  . Bee pollen Shortness Of Breath 05/07/2014  . Morphine and related Nausea And Vomiting 05/30/2011  . Varenicline Other (See Comments) 06/04/2015  . Codeine Nausea Only 09/24/2012  . Neomycin Rash 09/24/2012    Family History  Problem Relation Age of Onset  . Diabetes Mother   . Arthritis Father   . Cancer Father     Social History   Social History  . Marital status: Single    Spouse name: N/A  . Number of children: N/A  . Years of education: N/A   Occupational History  . Not on file.   Social History Main Topics  . Smoking status: Current Every Day Smoker    Packs/day: 0.10    Types: Cigarettes  . Smokeless tobacco: Never Used     Comment: using Nicoderm patches  . Alcohol use No  . Drug use: No  . Sexual activity: Not on file   Other Topics Concern  . Not on file   Social History Narrative  . No narrative on file    REVIEW OF SYSTEMS: Constitutional:  When she arrived home from rehabilitation facility a couple of weeks ago, she was ambulating independently as well as with a walker. Since she took a fall at home she's had intense left hip pain and can hardly turn herself in the bed without triggering intense pain. ENT:  No nose bleeds Pulm:  No cough. No new shortness of breath. CV:  No palpitations, no LE edema.  GU:  No hematuria, no frequency GI:  Per HPI Heme:  No unusual bleeding or bruising.   Transfusions:  For this admission, none. Neuro:  No headaches, no peripheral tingling or numbness Derm:   No itching, no rash or sores.  Endocrine:  No sweats or chills.  No polyuria or dysuria Immunization:  I didn't inquire as to recent immunizations. Travel:  None beyond local counties in last few months.    PHYSICAL EXAM: Vital signs in last 24 hours: Vitals:   01/08/17 0545 01/08/17 1009  BP: 115/60 125/65  Pulse: 89 85  Resp: 17   Temp: 98 F (36.7 C)    Wt Readings from Last 3 Encounters:  01/08/17 43.5 kg (96 lb)  06/07/11 44.9 kg (99 lb)    General: Frail, cachectic appearing elderly WF who is alert and mostly comfortable. Head:  No facial asymmetry or swelling.  Eyes:  Conjunctiva is pale. EOMI. No scleral icterus. Ears:  Slight HOH  Nose:  No discharge or congestion Mouth: Tongue midline. Full upper denture in place, not removed. Only incisors remain in the lower jaw. Mucosa is slightly dry. Neck:  No JVD, masses, thyromegaly. Lungs:  Unable to sit up or turns so pulmonary exam was anterior only. She is breathing easily and there is no adventitious sounds. Heart: RRR. No MRG. S1, S2 present. Abdomen:  Soft. Nontender. No distention. No HSM. Very well-healed scars in the upper abdomen at the right upper quadrant and midline..   Rectal: Unable to perform as patient reluctant to turn onto either side   Musc/Skeltl: Some kyphosis. No joint redness or swelling. Extremities:  No CCE.  Neurologic:  Alert. Appropriate. Calm. Oriented 3. No tremor. Able to move all 4 limbs but limb strength was not tested. Skin:  No rashes or sores. Purpura on the arms. Tattoos:  None observed. Nodes:  No cervical adenopathy.   Psych:  Calm, pleasant, cooperative.  Intake/Output from previous day: 06/17 0701 -  06/18 0700 In: -  Out: 750 [Urine:750] Intake/Output this shift: Total I/O In: 50 [P.O.:50] Out: -   LAB RESULTS:  Recent Labs  01/06/17 0337 01/07/17 0421 01/08/17 0411  WBC 17.6* 17.5* 20.1*  HGB 7.7* 7.6* 7.0*  HCT 23.5* 23.3* 21.6*  PLT 175 148* 178   BMET Lab  Results  Component Value Date   NA 133 (L) 01/08/2017   NA 134 (L) 01/07/2017   NA 135 01/06/2017   K 3.6 01/08/2017   K 3.7 01/07/2017   K 3.3 (L) 01/06/2017   CL 105 01/08/2017   CL 104 01/07/2017   CL 105 01/06/2017   CO2 21 (L) 01/08/2017   CO2 25 01/07/2017   CO2 25 01/06/2017   GLUCOSE 105 (H) 01/08/2017   GLUCOSE 108 (H) 01/07/2017   GLUCOSE 119 (H) 01/06/2017   BUN 6 01/08/2017   BUN 8 01/07/2017   BUN 12 01/06/2017   CREATININE 0.52 01/08/2017   CREATININE 0.52 01/07/2017   CREATININE 0.62 01/06/2017   CALCIUM 6.8 (L) 01/08/2017   CALCIUM 7.1 (L) 01/07/2017   CALCIUM 7.1 (L) 01/06/2017   LFT  Recent Labs  01/06/17 0337  PROT 4.2*  ALBUMIN 1.4*  AST 84*  ALT 50  ALKPHOS 258*  BILITOT 0.7   PT/INR Lab Results  Component Value Date   INR 1.34 01/06/2017   INR 1.74 01/03/2017   Hepatitis Panel No results for input(s): HEPBSAG, HCVAB, HEPAIGM, HEPBIGM in the last 72 hours. C-Diff No components found for: CDIFF Lipase  No results found for: LIPASE  Drugs of Abuse  No results found for: LABOPIA, COCAINSCRNUR, LABBENZ, AMPHETMU, THCU, LABBARB   RADIOLOGY STUDIES: No results found.   IMPRESSION:   *  Microcytic anemia. Iron/anemia panel with low iron, low iron binding capacity. Ferritin, iron saturation, folate, B12 normal or above normal. Suspect anemia of chronic disease with contribution of slow GI bleeding. Rule out ulcer. Rule out neoplasia, rule out AVMs.  *  History recurrent necrotizing pneumonias.  *  Fall with resulting pain in the left hip. Plain x-rays did not reveal any fracture, plan is to pursue MRI of the hip.  *  Chronic protein calorie malnutrition.  *  History of pancreatitis ultimately underwent Whipple's procedure in the 1990s. Patient reports fatty material floating in the commode following bowel movements suspect her Creon is under dosed.    PLAN:     *  Increased the dose of Creon.  *  Dr. Russella DarStark will be rounding on  the patient and offer his decision regarding endoscopic procedures.  I'm not sure she is a candidate for colonoscopy but I think an EGD may be within reason and doable  *  Consider infusing parenteral iron.     Jennye MoccasinSarah Gribbin  01/08/2017, 1:40 PM Pager: [phone]    Attending physician's note   I have taken a history, examined the patient and reviewed the chart. I agree with the Advanced Practitioner's note, impression and recommendations.  Anemia and heme pos stool. Anemia work up c/w ACD however could have Fe deficiency as additional diagnosis. Pancreatic insufficiency. She has L hip pain after a fall. At this point she cannot tolerate the standard left lateral decubitus positioning for EGD or colonoscopy. She does not feel she can tolerate a bowel prep. Palliative care following patient and further endoscopic studies may not be in line with evolving GOC. Await further evaluation of L hip, possible MRI.  Increase dose of Creon.  Claudette HeadMalcolm Keonta Alsip, MD [facility] CenterFACG  [phone] Mon-Fri 8a-5p [phone] after 5p, weekends, holidays

## 2017-01-08 NOTE — Progress Notes (Addendum)
Daily Progress Note   [name]       Date: 01/08/2017 DOB: 1946/05/02  Age: 71 y.o. MRN#: [id number] Attending Physician: Elease Etienne, MD Primary Care Physician: Lindwood Qua, MD Admit Date: 01/03/2017  Reason for Consultation/Follow-up: Establishing goals of care  Subjective: Patient in chair leaning over arm of chair. Daughter and brother at bedside. Daughter tells me that patient has had longstanding malnutrition d/t previous GI surgeries. She was doing well at SNF, then doing well at her brother's home, before she went back to her own home and fell. Their GOC are for patient to return to SNF for repeat rehab and long term care. If patient declines further or does not progress at rehab they would like to re-enroll in Hospice. They would like MRI completed- request some medication to help patient relax during MRI.  Discussed advanced healthcare planning- patient had previous DNR but revoked due to her daughter and brother "did not like it". Discussed their hesitations and reviewed role of CPR, likely outcomes in patient's with chronic illness, poor nutritional status, advanced age and frail state, and appropriate indications for DNR. All agreed to DNR status.  Patient still experiencing pain- states pain med works when she takes it, however, pain returns before it is time for next dose. MRI is pending to determine source of pain. Her daughter states that her leg went numb before she fell and she has long history of chronic back pain and DJD.    Review of Systems  Constitutional: Positive for malaise/fatigue.  Musculoskeletal: Positive for back pain and falls.  Psychiatric/Behavioral: Negative for depression. The patient does not have insomnia.     Length of Stay:  5  Current Medications: Scheduled Meds:  . cyclobenzaprine  5 mg Oral TID  . feeding supplement (ENSURE ENLIVE)  237 mL Oral TID BM  . heparin  5,000 Units Subcutaneous Q8H  . lidocaine  2 patch Transdermal Daily  . linaclotide  145 mcg Oral QAC breakfast  . lipase/protease/amylase  24,000 Units Oral TID WC  . metoprolol tartrate  25 mg Oral BID  . mirtazapine  15 mg Oral QHS  . mometasone-formoterol  2 puff Inhalation BID  . multivitamin with minerals  1 tablet Oral Daily  . pantoprazole  40 mg Oral Daily  . polyethylene glycol  17 g Oral Daily  . senna  1 tablet Oral Daily  . venlafaxine XR  37.5 mg Oral Q breakfast    Continuous Infusions: . meropenem (MERREM) IV Stopped (01/08/17 1051)    PRN Meds: acetaminophen **OR** acetaminophen, albuterol, metoprolol tartrate, ondansetron **OR** ondansetron (ZOFRAN) IV, oxyCODONE, sodium chloride flush  Physical Exam  Constitutional: She is oriented to person, place, and time.  Cardiovascular: Normal rate.   Pulmonary/Chest: Effort normal and breath sounds normal.  Abdominal: Soft.  Neurological: She is alert and oriented to person, place, and time.  Skin:  Scattered bruising            Vital Signs: BP 125/65   Pulse 85   Temp 98 F (36.7 C) (Oral)   Resp 17   Ht 4\' 11"  (1.499 m)   Wt 43.5 kg (96 lb)   SpO2 92%   BMI 19.39 kg/m  SpO2: SpO2: 92 % O2 Device: O2 Device: Not Delivered O2 Flow Rate:    Intake/output summary:  Intake/Output Summary (Last 24 hours) at 01/08/17 1313 Last data filed at 01/08/17 [id number]  Gross per 24 hour  Intake               50 ml  Output              350 ml  Net             -300 ml   LBM: Last BM Date: 01/07/17 Baseline Weight: Weight: 36.3 kg (80 lb) (pt reported from 2 days ago ) Most recent weight: Weight: 43.5 kg (96 lb)       Palliative Assessment/Data: PPS: 50%      Patient Active Problem List   Diagnosis Date Noted  . Palliative care by specialist   . Advance care  planning   . Protein-calorie malnutrition, severe 01/04/2017  . Acute respiratory failure (HCC) 01/03/2017  . Hyperkalemia 01/03/2017  . Sepsis (HCC) 01/03/2017  . Anemia 01/03/2017  . Leukocytosis 01/03/2017  . Hypoglycemia 01/03/2017  . Hyponatremia 01/03/2017  . COPD (chronic obstructive pulmonary disease) (HCC) 01/03/2017  . Hepatitis   . Acidosis   . Acute kidney injury (HCC)   . Dehydration   . Fall   . AKI (acute kidney injury) (HCC)   . Acute pyelonephritis   . Atherosclerosis of native artery of extremity with intermittent claudication (HCC) 06/07/2011    Palliative Care Assessment & Plan   Patient Profile: 71 y.o. female  with past medical history of arthritis, asthma, degenerative disc disease, hepatitis, COPD, chronic low back pain, failure to thrive admitted on 01/03/2017 with generalized low back pain, left leg pain s/p fall at home, weakness. Further workup also revealed sepsis r/t UTI, new onset A. fib  Assessment/Recommendations/Plan   Pain- will transition to long acting oxycontin 40mg  BID po with 10mg  q3hr prn for breakthroug pain (based on 24 hr dosing requirements and prn dose of 10 - 15% of 24 hr dose); add gabapentin 300mg ; increase mirtazapine to 30mg  po nightly  Recommend primary team offer sedation for MRI if repeat attempt is planned- IV lorazepam .5mg  may be helpful  GOC- recommend d/c to SNF for rehab- if patient continues to decline- refer to hospice  DNR  Goals of Care and Additional Recommendations:  Limitations on Scope of Treatment: Full Scope Treatment  Code Status:  DNR  Prognosis:   Unable to determine  Discharge Planning:  Skilled Nursing Facility for rehab with Palliative care service follow-up  Care plan was discussed  with patient and her daughter and her brother.  Thank you for allowing the Palliative Medicine Team to assist in the care of this patient.   Time In: 1235 Time Out: 1315 Total Time 35 mins Prolonged Time  Billed NO      Greater than 50%  of this time was spent counseling and coordinating care related to the above assessment and plan.  Ocie Bob, AGNP-C Palliative Medicine   Please contact Palliative Medicine Team phone at [phone] for questions and concerns.

## 2017-01-09 DIAGNOSIS — S32501D Unspecified fracture of right pubis, subsequent encounter for fracture with routine healing: Secondary | ICD-10-CM

## 2017-01-09 DIAGNOSIS — W19XXXS Unspecified fall, sequela: Secondary | ICD-10-CM

## 2017-01-09 DIAGNOSIS — S3210XD Unspecified fracture of sacrum, subsequent encounter for fracture with routine healing: Secondary | ICD-10-CM

## 2017-01-09 LAB — CBC
HCT: 30.6 % — ABNORMAL LOW (ref 36.0–46.0)
Hemoglobin: 10.2 g/dL — ABNORMAL LOW (ref 12.0–15.0)
MCH: 26.3 pg (ref 26.0–34.0)
MCHC: 33.3 g/dL (ref 30.0–36.0)
MCV: 78.9 fL (ref 78.0–100.0)
PLATELETS: 246 10*3/uL (ref 150–400)
RBC: 3.88 MIL/uL (ref 3.87–5.11)
RDW: 18.1 % — AB (ref 11.5–15.5)
WBC: 24.8 10*3/uL — AB (ref 4.0–10.5)

## 2017-01-09 NOTE — Progress Notes (Signed)
PROGRESS NOTE   Vanessa Massey  ZOX:[id number]    DOB: 01-Dec-1945    DOA: 01/03/2017  PCP: Lindwood Qua, MD   I have briefly reviewed patients previous medical records in [facility].  Brief Narrative:  71 year old female, lives alone, ambulates with the help of a walker, PMH of asthma/COPD not on home oxygen, told to have hepatitis at age 89, status post partial gastrectomy and pancreas surgery, depression, chronic low back pain, sustained a mechanical fall 2 days prior to admission when she was ambulating to the bathroom without her walker, denies any injuries, bleeding or LOC, able to get up by herself, presents with worsening low back pain, left hip pain. She had been seen at [facility] but no imaging studies performed. Home oxycodone did not provide relief. Also reported weakness but no dizziness or lightheadedness. No dysuria, urinary frequency, fever or chills. 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 Admitted for further management. New onset A. fib with RVR on 6/14.Confirmed ESBL Escherichia coli UTI. PAF with CVR. Ongoing left hip pain >Unable to do MRI hip on 6/16. As per daughter, beginning of this year was in SNF for 3 months, then discharged to patient's brother's home, was on home hospice-able to come off, and returned home a week ago. Palliative care consulted for goals of care. Left hip pain better. Trying MRI of left hip today. FOBT +. Hemoglobin again has gradually dropped down to 7. [facility] consulted.   Assessment & Plan:   Principal Problem:   Acute respiratory failure (HCC) Active Problems:   Atherosclerosis of native artery of extremity with intermittent claudication (HCC)   Acidosis   Acute kidney injury (HCC)   Dehydration   Hyperkalemia   Sepsis (HCC)   Anemia   Leukocytosis   Hypoglycemia  Hyponatremia   COPD (chronic obstructive pulmonary disease) (HCC)   Fall   AKI (acute kidney injury) (HCC)   Acute pyelonephritis   Protein-calorie malnutrition, severe   Palliative care by specialist   Advance care planning   Goals of care, counseling/discussion  ESBL Escherichia coli UTI:  -Patient denies urinary symptoms but urine microscopy and emphysematous cystitis seen on lumbar spine x-ray are concerning. No sepsis on admission.  - Urine culture growing ESBL, Rocephin changed to meropenem on 6/15 .  A. fib with RVR, new onset:  - Now PAF w CVR. Continue metoprolol 25 MG PO BID. 2-D echo with a preserved EF 55-60%, no regional wall motion abnormality, and with evidence of diastolic dysfunction . - She is not an anticoagulation candidate secondary to multiple falls, as well secondary to her anemia and evidence of GI bleed   Acute/subacute sacral fracture, pelvic fracture - MRI left hip 6/18 showing Findings consistent with acute or subacute bilateral sacral fractures and right inferior pubic ramus and right pubic bone fractures. Negative for hip fracture. - Discussed with orthopedic Dr. Magnus Ivan, she is not a surgical candidate, continue with supportive care, pain control, and weight bearing as tolerated.  Transient hypoxia/acute respiratory failure with hypoxia:  - She was 86% on room air in the ED. May have been related to acute illness complicating COPD. No clinical bronchospasm. Chest x-ray without acute findings. Resolved. Stable.  COPD:  - When necessary broncho-dilator nebulizations. No clinical bronchospasm. Stable.  Acute kidney injury:  - Resolved.  Hyperkalemia/hypokalemia: - Hyperkalemia resolved. Aldactone on hold. Replaced  Non-anion gap metabolic acidosis: - Unclear etiology. No GI losses reported.? RTA 4-? Etiology. She had bicarbonate of 10 on admission. Resolved after IV bicarbonate drip.   Dehydration with hyponatremia: -  Resolved. At high risk for  recurrent dehydration due to inconsistent oral intake.  Chronic microcytic anemia:  - Presented with hemoglobin of 8.3 which dropped to 6.6 after hydration. Transfuse 1 unit PRBC 6/14, another unit 6/18, motor been improved from 7-10.2 after 1 unit only.  GI bleed  - Gastric nephrology input greatly appreciated, at this point no EGD/colonoscopy is indicated as she cannot lay still for procedure, as well patient has declined EGD/colonoscopy, continue with supportive care, transfuse as needed, IV iron as needed (will avoid IV iron currently in the setting of active infection ) - She is not a candidate for anticoagulation  Hypoglycemia: - Probably due to poor oral intake. Serum glucose on presentation <20. Monitor CBGs closely. Resolved.  Elevated INR/abnormal LFTs/hepatitis history: - ? Cirrhosis. Acute hepatitis panel: Negative and HIV status.: Nonreactive.? Nutritional. INR down to 1.34.  Leukocytosis: - ? Related to acute infectious etiology versus others. Review of labs in care everywhere shows that she has chronic leukocytosis at least since 2015 ranging between tens and 40, etiology of this is unclear. Check peripheral smear. Trend CBCs for improvement and may need to consider outpatient hematology consultation. Leukocytosis continues to improve 36.3 > 22.5 >17.6 >20.1>24.8. Continue to follow CBCs.  Severe protein calorie malnutrition: Dietitian consultation.  Low back and left hip pain: X-rays negative for fractures. PT evaluation. Pain management. Patient reports chronic low back pain for 8 years and has been on opioids since then. Ongoing hip pain. Refused to get out of bed. Added Flexeril and K pad. Pain is better controlled. Trial of MRI left hip.  Adult failure to thrive: Probably multifactorial related to age and multiple severe comorbidities. Palliative care consultation and follow-up appreciated.  Depression: Continue venlafaxine. No suicidal or homicidal ideations  reported.  evere weight loss: Reported by patient's daughter on 6/16. Apparently was released from hospice. Etiology felt to be due to poor oral intake. Outpatient evaluation as deemed necessary.  Chronic pancreatitis: Based on pancreatic supplements PTA. Could also because of severe weight loss. No diarrhea. GI increasing Creon.   DVT prophylaxis: Subcutaneous heparin Code Status: Full Family Communication: None at bedside. Disposition: SNF.   Consultants:  Palliative care team GI  Procedures:  LUE PICC line.   Antimicrobials:  IV ceftriaxone -discontinued. IV meropenem.   Subjective: Reports she is feeling better today, but still complains of hip and lower back pain on ambulation, denies nausea, vomiting, chest pain or shortness of breath   Objective:  Vitals:   01/09/17 0609 01/09/17 0800 01/09/17 0813 01/09/17 1001  BP: (!) 143/88 128/68  124/63  Pulse: 91 (!) 102  (!) 110  Resp: 18 18    Temp: 98.6 F (37 C) 98.4 F (36.9 C)    TempSrc:  Oral    SpO2: 96% 93% 94%   Weight:      Height:        Examination:  Small built, frail, chronically ill-looking middle-aged female lying comfortably supine in bed  Good air entry bilaterally, clear to auscultation, negative accessory muscle  S1 and S2 heard,  no rubs murmurs gallops  Abdomen soft, nontender, nondistended, bowel sounds present Alert and oriented. No focal neurological deficits. Symmetric 5 x 5 power. No bruising over left hip or lower back. Painful range of movements in bilateral hips. Judgement  and insight appear normal. Mood & affect flat.    Data Reviewed: I have personally reviewed following labs and imaging studies  CBC:  Recent Labs Lab 01/03/17 1300  01/05/17 0425 01/06/17 0337 01/07/17 0421 01/08/17 0411 01/09/17 0635  WBC 36.9*  < > 22.5* 17.6* 17.5* 20.1* 24.8*  NEUTROABS 33.6*  --  19.0* 14.0* 13.1* 15.3*  --   HGB 8.3*  < > 8.3* 7.7* 7.6* 7.0* 10.2*  HCT 26.2*  < > 24.8* 23.5*  23.3* 21.6* 30.6*  MCV 75.1*  < > 74.0* 75.1* 76.4* 76.9* 78.9  PLT 456*  < > 309 175 148* 178 246  < > = values in this interval not displayed. Basic Metabolic Panel:  Recent Labs Lab 01/04/17 2357 01/05/17 0425 01/06/17 0337 01/07/17 0421 01/08/17 0411  NA 134* 135 135 134* 133*  K 3.4* 5.1 3.3* 3.7 3.6  CL 109 110 105 104 105  CO2 15* 16* 25 25 21*  GLUCOSE 93 85 119* 108* 105*  BUN 26* 22* 12 8 6   CREATININE 1.09* 1.01* 0.62 0.52 0.52  CALCIUM 7.3* 7.4* 7.1* 7.1* 6.8*   Liver Function Tests:  Recent Labs Lab 01/04/17 0805 01/06/17 0337  AST 176* 84*  ALT 75* 50  ALKPHOS 342* 258*  BILITOT 2.0* 0.7  PROT 4.5* 4.2*  ALBUMIN 1.5* 1.4*   Coagulation Profile:  Recent Labs Lab 01/03/17 1730 01/06/17 0337  INR 1.74 1.34   Cardiac Enzymes:  Recent Labs Lab 01/03/17 1053  CKTOTAL 135   HbA1C: No results for input(s): HGBA1C in the last 72 hours. CBG:  Recent Labs Lab 01/04/17 1634 01/04/17 2242 01/05/17 0751 01/05/17 1147 01/05/17 1716  GLUCAP 109* 91 76 82 90    Recent Results (from the past 240 hour(s))  Urine culture     Status: Abnormal   Collection Time: 01/03/17  3:45 PM  Result Value Ref Range Status   Specimen Description URINE, CATHETERIZED  Final   Special Requests NONE  Final   Culture (A)  Final    >=100,000 COLONIES/mL ESCHERICHIA COLI Confirmed Extended Spectrum Beta-Lactamase Producer (ESBL)    Report Status 01/05/2017 FINAL  Final   Organism ID, Bacteria ESCHERICHIA COLI (A)  Final      Susceptibility   Escherichia coli - MIC*    AMPICILLIN >=32 RESISTANT Resistant     CEFAZOLIN >=64 RESISTANT Resistant     CEFTRIAXONE >=64 RESISTANT Resistant     CIPROFLOXACIN >=4 RESISTANT Resistant     GENTAMICIN <=1 SENSITIVE Sensitive     IMIPENEM <=0.25 SENSITIVE Sensitive     NITROFURANTOIN >=512 RESISTANT Resistant     TRIMETH/SULFA <=20 SENSITIVE Sensitive     AMPICILLIN/SULBACTAM >=32 RESISTANT Resistant     PIP/TAZO <=4  SENSITIVE Sensitive     Extended ESBL POSITIVE Resistant     * >=100,000 COLONIES/mL ESCHERICHIA COLI  MRSA PCR Screening     Status: Abnormal   Collection Time: 01/07/17  9:08 PM  Result Value Ref Range Status   MRSA by PCR POSITIVE (A) NEGATIVE Final    Comment:        The GeneXpert MRSA Assay (FDA approved for NASAL specimens only), is one component of a comprehensive MRSA colonization surveillance program. It is not intended to diagnose MRSA infection nor to guide or monitor treatment for MRSA infections. RESULT CALLED TO, READ BACK BY AND VERIFIED WITH: Meda Klinefelter, RN 2252 01/07/17 T. TYSOR          Radiology Studies: Mr Hip Left  Wo Contrast  Result Date: 01/09/2017 CLINICAL DATA:  Left hip pain. History of fall 01/01/2017. Initial encounter. EXAM: MR OF THE LEFT HIP WITHOUT CONTRAST TECHNIQUE: Multiplanar, multisequence MR imaging was performed. No intravenous contrast was administered. COMPARISON:  Plain film of the pelvis 01/03/2017. FINDINGS: Bones: There is marrow edema in the right and left sacrum due to bilateral sacral fractures. The patient also has a right inferior pubic ramus fracture and right pubic bone fracture. No other fracture is identified. Both hips are located. There is no avascular necrosis of the femoral heads. Articular cartilage and labrum Articular cartilage: Evaluation is somewhat limited by patient motion but only mild degenerative change is seen. Labrum:  Appears intact. Joint or bursal effusion Joint effusion:  None. Bursae:  Negative. Muscles and tendons Muscles and tendons: Increased T2 signal is seen in all imaged musculature. No muscle or tendon tear is identified. Other findings Miscellaneous: Diffuse subcutaneous edema is present. There is a small volume of free pelvic fluid. IMPRESSION: Findings consistent with acute or subacute bilateral sacral fractures and right inferior pubic ramus and right pubic bone fractures. Negative for hip fracture. Marked  body wall edema with a small volume of free pelvic fluid and intramuscular edema likely due to volume overload. Electronically Signed   By: Drusilla Kannerhomas  Dalessio M.D.   On: 01/09/2017 08:17        Scheduled Meds: . cyclobenzaprine  5 mg Oral TID  . feeding supplement (ENSURE ENLIVE)  237 mL Oral TID BM  . heparin  5,000 Units Subcutaneous Q8H  . lidocaine  2 patch Transdermal Daily  . linaclotide  145 mcg Oral QAC breakfast  . lipase/protease/amylase  36,000 Units Oral TID WC  . metoprolol tartrate  25 mg Oral BID  . mirtazapine  30 mg Oral QHS  . mometasone-formoterol  2 puff Inhalation BID  . multivitamin with minerals  1 tablet Oral Daily  . oxyCODONE  40 mg Oral Q12H  . pantoprazole  40 mg Oral Daily  . polyethylene glycol  17 g Oral Daily  . senna  1 tablet Oral Daily  . venlafaxine XR  37.5 mg Oral Q breakfast   Continuous Infusions: . sodium chloride    . meropenem (MERREM) IV Stopped (01/09/17 1035)     LOS: 6 days     Huey BienenstockELGERGAWY, DAWOOD, MD [facility] Pager [phone]  If 7PM-7AM, please contact night-coverage [email or web address] Password [email or web address] 01/09/2017, 12:09 PM

## 2017-01-09 NOTE — Progress Notes (Signed)
Physical Therapy Treatment Patient Details Name: Vanessa Massey MRN: [id number] DOB: [birth year] Today's Date: 01/09/2017    History of Present Illness Vanessa Massey is a 71 y.o. female with medical history significant for arthritis, asthma, degenerative disc disease hepatitis presents to the emergency department. Chief complaint generalized weakness persistent low back pain and left leg pain. Initial evaluation reveals a lactic acidosis, acute kidney injury, hyperkalemia and leukocytosis and hypoglycemia.  Pt with mechanical fall 2 days prior to admission.  MRI revealed bilat sacral fxs and R inferior pubic ramus and pubic bone fx; WBAT.     PT Comments    Pt extremely limited by pain this session. RN requesting PT to see to assist with mobility to chair, however, pt unable to tolerate mobility. Performed bed mobility, and was able to sit EOB, however, pt yelling out to lay back down secondary to pain. Pt requiring max A during mobility this session. Current plan remains appropriate. Will continue to follow acutely.    Follow Up Recommendations  SNF;Supervision/Assistance - 24 hour     Equipment Recommendations  None recommended by PT    Recommendations for Other Services       Precautions / Restrictions Precautions Precautions: Fall Restrictions Weight Bearing Restrictions: Yes RLE Weight Bearing: Weight bearing as tolerated Other Position/Activity Restrictions: Per MD notes, pt is WBAT.     Mobility  Bed Mobility Overal bed mobility: Needs Assistance Bed Mobility: Rolling;Sidelying to Sit;Sit to Sidelying Rolling: Mod assist Sidelying to sit: Max assist     Sit to sidelying: Max assist General bed mobility comments: Requesting to use bed pan so rolling X 2. Mod A required secondary to pain. Max A for transition to sitting. Pt with increased pain upon sitting and refusing to perform anymore mobility and laying herself down. Max A for controlled descent and Max A +2 for  scooting up in bed. Heavy use of bed pad to assist with hip movement during bed mobility.   Transfers                 General transfer comment: Pt refused secondary to pain   Ambulation/Gait                 Stairs            Wheelchair Mobility    Modified Rankin (Stroke Patients Only)       Balance Overall balance assessment: History of Falls;Needs assistance Sitting-balance support: Bilateral upper extremity supported;Feet supported Sitting balance-Leahy Scale: Poor Sitting balance - Comments: Reliant on mod to max A to maintain sitting balance secondary to pain.                                     Cognition Arousal/Alertness: Awake/alert Behavior During Therapy: WFL for tasks assessed/performed Overall Cognitive Status: Within Functional Limits for tasks assessed                                        Exercises      General Comments General comments (skin integrity, edema, etc.): RN requesting PT to see to assist with mobility into chair. Unable to get to chair secondary to increased pain. RN notified.       Pertinent Vitals/Pain Pain Assessment: Faces Faces Pain Scale: Hurts worst Pain Location: both hips and back Pain  Descriptors / Indicators: Other (Comment);Sharp (gnawing) Pain Intervention(s): Limited activity within patient's tolerance;Monitored during session;Repositioned    Home Living                      Prior Function            PT Goals (current goals can now be found in the care plan section) Acute Rehab PT Goals Patient Stated Goal: to go to SNF PT Goal Formulation: With patient Time For Goal Achievement: 01/22/17 Potential to Achieve Goals: Fair Progress towards PT goals: Not progressing toward goals - comment (limited by pain this session )    Frequency    Min 2X/week      PT Plan Current plan remains appropriate    Co-evaluation              AM-PAC PT "6  Clicks" Daily Activity  Outcome Measure  Difficulty turning over in bed (including adjusting bedclothes, sheets and blankets)?: Total Difficulty moving from lying on back to sitting on the side of the bed? : Total Difficulty sitting down on and standing up from a chair with arms (e.g., wheelchair, bedside commode, etc,.)?: Total Help needed moving to and from a bed to chair (including a wheelchair)?: A Lot Help needed walking in hospital room?: Total Help needed climbing 3-5 steps with a railing? : Total 6 Click Score: 7    End of Session   Activity Tolerance: Patient limited by pain Patient left: in bed;with call bell/phone within reach;with bed alarm set Nurse Communication: Mobility status (nursing intern present throughout session. ) PT Visit Diagnosis: Unsteadiness on feet (R26.81);Muscle weakness (generalized) (M62.81);Difficulty in walking, not elsewhere classified (R26.2);Pain Pain - part of body:  (sacrum and pelvis )     Time: [id number] PT Time Calculation (min) (ACUTE ONLY): 26 min  Charges:  $Therapeutic Activity: 23-37 mins                    G Codes:       Gladys DammeBrittany Cordelro Gautreau, PT, DPT  Acute Rehabilitation Services  Pager: [phone]    Lehman PromBrittany S Defiance Jon 01/09/2017, 6:35 PM

## 2017-01-09 NOTE — Progress Notes (Signed)
Progress Note   Subjective   More energy and improved appetite today. Eating breakfast. Hip and back pain unchanged.    Objective  Vital signs in last 24 hours: Temp:  [98.2 F (36.8 C)-98.8 F (37.1 C)] 98.4 F (36.9 C) (06/19 0800) Pulse Rate:  [85-102] 102 (06/19 0800) Resp:  [16-20] 18 (06/19 0609) BP: (111-143)/(45-88) 128/68 (06/19 0800) SpO2:  [91 %-98 %] 94 % (06/19 0813) Last BM Date: 01/07/17  General: Alert, well-developed, elderly, frail, in NAD Heart:  Regular rate and rhythm; no murmurs Chest: Clear to ascultation bilaterally Abdomen:  Soft, nontender and nondistended. Normal bowel sounds, without guarding, and without rebound.   Extremities:  Without edema. Neurologic:  Alert and  oriented x4; grossly normal neurologically. Psych:  Alert and cooperative. Normal mood and affect.  Intake/Output from previous day: 06/18 0701 - 06/19 0700 In: 379 [P.O.:50; Blood:329] Out: 100 [Urine:100] Intake/Output this shift: Total I/O In: -  Out: 400 [Urine:400]  Lab Results:  Recent Labs  01/07/17 0421 01/08/17 0411 01/09/17 0635  WBC 17.5* 20.1* 24.8*  HGB 7.6* 7.0* 10.2*  HCT 23.3* 21.6* 30.6*  PLT 148* 178 246   BMET  Recent Labs  01/07/17 0421 01/08/17 0411  NA 134* 133*  K 3.7 3.6  CL 104 105  CO2 25 21*  GLUCOSE 108* 105*  BUN 8 6  CREATININE 0.52 0.52  CALCIUM 7.1* 6.8*    Studies/Results: Mr Hip Left Wo Contrast  Result Date: 01/09/2017 CLINICAL DATA:  Left hip pain. History of fall 01/01/2017. Initial encounter. EXAM: MR OF THE LEFT HIP WITHOUT CONTRAST TECHNIQUE: Multiplanar, multisequence MR imaging was performed. No intravenous contrast was administered. COMPARISON:  Plain film of the pelvis 01/03/2017. FINDINGS: Bones: There is marrow edema in the right and left sacrum due to bilateral sacral fractures. The patient also has a right inferior pubic ramus fracture and right pubic bone fracture. No other fracture is identified. Both  hips are located. There is no avascular necrosis of the femoral heads. Articular cartilage and labrum Articular cartilage: Evaluation is somewhat limited by patient motion but only mild degenerative change is seen. Labrum:  Appears intact. Joint or bursal effusion Joint effusion:  None. Bursae:  Negative. Muscles and tendons Muscles and tendons: Increased T2 signal is seen in all imaged musculature. No muscle or tendon tear is identified. Other findings Miscellaneous: Diffuse subcutaneous edema is present. There is a small volume of free pelvic fluid. IMPRESSION: Findings consistent with acute or subacute bilateral sacral fractures and right inferior pubic ramus and right pubic bone fractures. Negative for hip fracture. Marked body wall edema with a small volume of free pelvic fluid and intramuscular edema likely due to volume overload. Electronically Signed   By: Drusilla Kanner M.D.   On: 01/09/2017 08:17      Assessment & Plan   1. Heme + stool and anemia. Hb increased to 10.2 post transfusion. ACD with possible additional IDA.   2. Anticoagulation on hold due to history of falls, frailness.   3. Left hip and back pain. MRI showed multiple acute or subacute fractures in sacrum, right inferior pubic ramus, right pubic bone.   I am reluctant to recommend colonoscopy and EGD as positioning may not be safe with acute or subacute fractures. In addition the patient does not want to have a colonoscopy or EGD, take a bowel prep or be positioned for procedures at this time. Consider IV Fe. Transfusions as needed. Occult GI blood loss may resolve  off anticoagulants. Palliative care following and establishing GOC. GI signing off.    Principal Problem:   Acute respiratory failure (HCC) Active Problems:   Atherosclerosis of native artery of extremity with intermittent claudication (HCC)   Acidosis   Acute kidney injury (HCC)   Dehydration   Hyperkalemia   Sepsis (HCC)   Anemia   Leukocytosis    Hypoglycemia   Hyponatremia   COPD (chronic obstructive pulmonary disease) (HCC)   Fall   AKI (acute kidney injury) (HCC)   Acute pyelonephritis   Protein-calorie malnutrition, severe   Palliative care by specialist   Advance care planning   Goals of care, counseling/discussion     LOS: 6 days   Kenzley Ke T. Russella DarStark MD 01/09/2017, 9:25 AM

## 2017-01-10 DIAGNOSIS — S329XXA Fracture of unspecified parts of lumbosacral spine and pelvis, initial encounter for closed fracture: Secondary | ICD-10-CM

## 2017-01-10 DIAGNOSIS — S3210XA Unspecified fracture of sacrum, initial encounter for closed fracture: Secondary | ICD-10-CM

## 2017-01-10 LAB — TYPE AND SCREEN
ABO/RH(D): O POS
Antibody Screen: NEGATIVE
UNIT DIVISION: 0

## 2017-01-10 LAB — CBC
HCT: 30.2 % — ABNORMAL LOW (ref 36.0–46.0)
HEMOGLOBIN: 9.8 g/dL — AB (ref 12.0–15.0)
MCH: 25.3 pg — ABNORMAL LOW (ref 26.0–34.0)
MCHC: 32.5 g/dL (ref 30.0–36.0)
MCV: 77.8 fL — ABNORMAL LOW (ref 78.0–100.0)
Platelets: 366 10*3/uL (ref 150–400)
RBC: 3.88 MIL/uL (ref 3.87–5.11)
RDW: 18.5 % — ABNORMAL HIGH (ref 11.5–15.5)
WBC: 18.9 10*3/uL — AB (ref 4.0–10.5)

## 2017-01-10 LAB — BPAM RBC
Blood Product Expiration Date: 201807152359
ISSUE DATE / TIME: 201806190113
Unit Type and Rh: 5100

## 2017-01-10 MED ORDER — MUPIROCIN 2 % EX OINT
1.0000 "application " | TOPICAL_OINTMENT | Freq: Two times a day (BID) | CUTANEOUS | Status: DC
  Administered 2017-01-10 – 2017-01-11 (×3): 1 via NASAL
  Filled 2017-01-10: qty 22

## 2017-01-10 MED ORDER — ENSURE ENLIVE PO LIQD
237.0000 mL | Freq: Three times a day (TID) | ORAL | Status: DC
  Administered 2017-01-11: 237 mL via ORAL

## 2017-01-10 MED ORDER — CHLORHEXIDINE GLUCONATE CLOTH 2 % EX PADS
6.0000 | MEDICATED_PAD | Freq: Every day | CUTANEOUS | Status: DC
  Administered 2017-01-10 – 2017-01-11 (×2): 6 via TOPICAL

## 2017-01-10 NOTE — Progress Notes (Signed)
Daily Progress Note   [name]       Date: 01/10/2017 DOB: 1945/11/28  Age: 71 y.o. MRN#: [id number] Attending Physician: Starleen Arms, MD Primary Care Physician: Lindwood Qua, MD Admit Date: 01/03/2017  Reason for Consultation/Follow-up: Establishing goals of care  Subjective: Patient in bed. Daughter at bedside. Notes pain is improved. Pain limited participation in PT yesterday. Pt has pelvic fractures- not candidate for surgery. Recommend premed before physical therapy.  Note GI bleed- need for transfusions, no workup. We discussed long term care goals. Give patient's poor nutritional status, likely GI bleed, likelielhood of recurrent infections, and poor likelihood of healing patient and family agree that patient is declining and increasing functional status is going to be difficult. They are interested in returning to Hospice care again when patient returns to her previous facility.     Review of Systems  Constitutional: Positive for malaise/fatigue.  Musculoskeletal: Positive for back pain and falls.  Psychiatric/Behavioral: Negative for depression. The patient does not have insomnia.     Length of Stay: 7  Current Medications: Scheduled Meds:  . cyclobenzaprine  5 mg Oral TID  . feeding supplement (ENSURE ENLIVE)  237 mL Oral TID BM  . heparin  5,000 Units Subcutaneous Q8H  . lidocaine  2 patch Transdermal Daily  . linaclotide  145 mcg Oral QAC breakfast  . lipase/protease/amylase  36,000 Units Oral TID WC  . metoprolol tartrate  25 mg Oral BID  . mirtazapine  30 mg Oral QHS  . mometasone-formoterol  2 puff Inhalation BID  . multivitamin with minerals  1 tablet Oral Daily  . oxyCODONE  40 mg Oral Q12H  . pantoprazole  40 mg Oral Daily  .  polyethylene glycol  17 g Oral Daily  . senna  1 tablet Oral Daily  . venlafaxine XR  37.5 mg Oral Q breakfast    Continuous Infusions: . sodium chloride    . meropenem (MERREM) IV 1 g (01/10/17 0921)    PRN Meds: acetaminophen **OR** acetaminophen, albuterol, metoprolol tartrate, ondansetron **OR** ondansetron (ZOFRAN) IV, oxyCODONE, sodium chloride flush  Physical Exam  Constitutional: She is oriented to person, place, and time.  Cardiovascular: Normal rate.   Pulmonary/Chest: Effort normal and breath sounds normal.  Abdominal: Soft.  Neurological: She is alert and oriented to person,  place, and time.  Skin:  Scattered bruising            Vital Signs: BP (!) 131/55 (BP Location: Right Arm)   Pulse 85   Temp 98.9 F (37.2 C)   Resp 18   Ht 4\' 11"  (1.499 m)   Wt 43.5 kg (96 lb)   SpO2 92%   BMI 19.39 kg/m  SpO2: SpO2: 92 % O2 Device: O2 Device: Not Delivered O2 Flow Rate:    Intake/output summary:   Intake/Output Summary (Last 24 hours) at 01/10/17 1034 Last data filed at 01/10/17 1191  Gross per 24 hour  Intake               10 ml  Output              400 ml  Net             -390 ml   LBM: Last BM Date: 01/08/17 Baseline Weight: Weight: 36.3 kg (80 lb) (pt reported from 2 days ago ) Most recent weight: Weight: 43.5 kg (96 lb)       Palliative Assessment/Data: PPS: 50%    Flowsheet Rows     Most Recent Value  Intake Tab  Referral Department  Hospitalist  Unit at Time of Referral  Med/Surg Unit  Palliative Care Primary Diagnosis  Pulmonary  Date Notified  01/06/17  Palliative Care Type  New Palliative care  Reason for referral  Clarify Goals of Care  Date of Admission  01/03/17  Date first seen by Palliative Care  01/07/17  # of days Palliative referral response time  1 Day(s)  # of days IP prior to Palliative referral  3  Clinical Assessment  Psychosocial & Spiritual Assessment  Palliative Care Outcomes      Patient Active Problem List    Diagnosis Date Noted  . Goals of care, counseling/discussion   . Palliative care by specialist   . Advance care planning   . Protein-calorie malnutrition, severe 01/04/2017  . Acute respiratory failure (HCC) 01/03/2017  . Hyperkalemia 01/03/2017  . Sepsis (HCC) 01/03/2017  . Anemia 01/03/2017  . Leukocytosis 01/03/2017  . Hypoglycemia 01/03/2017  . Hyponatremia 01/03/2017  . COPD (chronic obstructive pulmonary disease) (HCC) 01/03/2017  . Hepatitis   . Acidosis   . Acute kidney injury (HCC)   . Dehydration   . Fall   . AKI (acute kidney injury) (HCC)   . Acute pyelonephritis   . Atherosclerosis of native artery of extremity with intermittent claudication (HCC) 06/07/2011    Palliative Care Assessment & Plan   Patient Profile: 71 y.o. female  with past medical history of arthritis, asthma, degenerative disc disease, hepatitis, COPD, chronic low back pain, failure to thrive admitted on 01/03/2017 with generalized low back pain, left leg pain s/p fall at home, weakness. Further workup also revealed sepsis r/t UTI, new onset A. Fib, anemia, GI Bleed.  Assessment/Recommendations/Plan   Pain- continue oxycontin and oxycodone as ordered, recommend premed with prn pain med before physical therapy  GOC- family and patient desire to d/c to her original facility and return to Hospice care  DNR  Goals of Care and Additional Recommendations:  Limitations on Scope of Treatment: No Artificial Feeding, No Diagnostics and No Surgical Procedures  Code Status:  DNR  Prognosis:   Unable to determine  Discharge Planning:  Skilled Nursing Facility with Hospice  Care plan was discussed with patient and her daughter  Thank you for allowing the Palliative Medicine Team  to assist in the care of this patient.   Time In: 1020 Time Out: 1045 Total Time 25 mins Prolonged Time Billed NO      Greater than 50%  of this time was spent counseling and coordinating care related to the above  assessment and plan.  Vanessa Massey, AGNP-C Palliative Medicine   Please contact Palliative Medicine Team phone at [phone] for questions and concerns.

## 2017-01-10 NOTE — Progress Notes (Signed)
PROGRESS NOTE   Vanessa Massey  ZOX:[id number]    DOB: February 13, 1946    DOA: 01/03/2017  PCP: Lindwood Qua, MD   I have briefly reviewed patients previous medical records in [facility].  Brief Narrative:  71 year old female, lives alone, ambulates with the help of a walker, PMH of asthma/COPD not on home oxygen, told to have hepatitis at age 44, status post partial gastrectomy and pancreas surgery, depression, chronic low back pain, sustained a mechanical fall 2 days prior to admission when she was ambulating to the bathroom without her walker, denies any injuries, bleeding or LOC, able to get up by herself, presents with worsening low back pain, left hip pain. She had been seen at [facility] but no imaging studies performed. Home oxycodone did not provide relief. Also reported weakness but no dizziness or lightheadedness. No dysuria, urinary frequency, fever or chills. 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 Admitted for further management. New onset A. fib with RVR on 6/14.Confirmed ESBL Escherichia coli UTI. PAF with CVR. Ongoing left hip pain >Unable to do MRI hip on 6/16. As per daughter, beginning of this year was in SNF for 3 months, then discharged to patient's brother's home, was on home hospice-able to come off, and returned home a week ago. Palliative care consulted for goals of care. Left hip pain better. Trying MRI of left hip today. FOBT +. Hemoglobin again has gradually dropped down to 7. [facility] consulted.   Assessment & Plan:   Principal Problem:   Acute respiratory failure (HCC) Active Problems:   Atherosclerosis of native artery of extremity with intermittent claudication (HCC)   Acidosis   Acute kidney injury (HCC)   Dehydration   Hyperkalemia   Sepsis (HCC)   Anemia   Leukocytosis   Hypoglycemia  Hyponatremia   COPD (chronic obstructive pulmonary disease) (HCC)   Fall   AKI (acute kidney injury) (HCC)   Acute pyelonephritis   Protein-calorie malnutrition, severe   Palliative care by specialist   Advance care planning   Goals of care, counseling/discussion  ESBL Escherichia coli UTI:  -Patient denies urinary symptoms but urine microscopy and emphysematous cystitis seen on lumbar spine x-ray are concerning. No sepsis on admission.  - Urine culture growing ESBL, Rocephin changed to meropenem on 6/15 , still with significant leukocytosis, but over all trending down , so will continue with meropenem.  A. fib with RVR, new onset:  - Now PAF w CVR. Continue metoprolol 25 MG PO BID. 2-D echo with a preserved EF 55-60%, no regional wall motion abnormality, and with evidence of diastolic dysfunction . - She is not an anticoagulation candidate secondary to multiple falls, as well secondary to her anemia and evidence of GI bleed   Acute/subacute sacral fracture, pelvic fracture - MRI left hip 6/18 showing Findings consistent with acute or subacute bilateral sacral fractures and right inferior pubic ramus and right pubic bone fractures. Negative for hip fracture. - Discussed with orthopedic Dr. Magnus Ivan, she is not a surgical candidate, continue with supportive care, pain control, and weight bearing as tolerated. Seen by PT, will need premedication prior to physical therapy, accommodation for SNF placement by PT.  Transient hypoxia/acute respiratory failure with hypoxia:  - She was 86% on room air in the ED. May have been related to acute illness complicating COPD. No clinical bronchospasm. Chest x-ray without acute findings.  Resolved. Stable.  COPD:  - When necessary broncho-dilator nebulizations. No clinical bronchospasm. Stable.  Acute kidney injury:  - Resolved.  Hyperkalemia/hypokalemia: - Hyperkalemia resolved. Aldactone on hold. Replaced  Non-anion gap metabolic acidosis: -  Unclear etiology. No GI losses reported.? RTA 4-? Etiology. She had bicarbonate of 10 on admission. Resolved after IV bicarbonate drip.   Dehydration with hyponatremia: -  Resolved. At high risk for recurrent dehydration due to inconsistent oral intake.  Chronic microcytic anemia:  - Presented with hemoglobin of 8.3 which dropped to 6.6 after hydration. He received 1 unit PRBC on 6/14, and 6/18, since then hemoglobin is stable, it is 9.8 today .  GI bleed  - Gastric nephrology input greatly appreciated, at this point no EGD/colonoscopy is indicated as she cannot lay still for procedure, as well patient has declined EGD/colonoscopy, continue with supportive care, transfuse as needed, IV iron as needed (will avoid IV iron currently in the setting of active infection ) - She is not a candidate for anticoagulation  Hypoglycemia: - Probably due to poor oral intake. Serum glucose on presentation <20. Monitor CBGs closely. Resolved.  Elevated INR/abnormal LFTs/hepatitis history: - ? Cirrhosis. Acute hepatitis panel: Negative and HIV status.: Nonreactive.? Nutritional. INR down to 1.34.  Leukocytosis: - ? Related to acute infectious etiology versus others. Review of labs in care everywhere shows that she has chronic leukocytosis at least since 2015 ranging between tens and 40, etiology of this is unclear. Check peripheral smear. Trend CBCs for improvement and may need to consider outpatient hematology consultation.  overall leukocytosis trending down, today is 18.9   Severe protein calorie malnutrition: Dietitian consultation.  Adult failure to thrive: Probably multifactorial related to age and multiple severe comorbidities. Palliative care consultation and follow-up appreciated.  Depression: Continue venlafaxine. No suicidal or homicidal ideations reported.  evere weight loss: Reported by patient's daughter on 6/16. Apparently was released from hospice. Etiology felt to be due to poor oral  intake. Outpatient evaluation as deemed necessary.  Chronic pancreatitis: Based on pancreatic supplements PTA. Could also because of severe weight loss. No diarrhea. GI increasing Creon.   DVT prophylaxis: Subcutaneous heparin Code Status: Full Family Communication: None at bedside. Disposition: SNF in 24 hours of white blood cell count keep trending down   Consultants:  Palliative care team GI  Procedures:  LUE PICC line.   Antimicrobials:  IV ceftriaxone -discontinued. IV meropenem.   Subjective:  complains of lower back/hip pain, but appears to be improving, was able to sit on the chair yesterday, denies any more blood in stool, no chest pain, no shortness of breath.   Objective:  Vitals:   01/09/17 1347 01/09/17 2045 01/09/17 2049 01/10/17 0612  BP: 117/82  (!) 129/53 (!) 131/55  Pulse: 88 86 89 85  Resp:  18 18 18   Temp: 98.8 F (37.1 C)  98.8 F (37.1 C) 98.9 F (37.2 C)  TempSrc: Oral     SpO2: 97% 95% 96% 92%  Weight:      Height:        Examination:  Small built, frail, chronically ill-looking middle-aged female lying comfortably supine in bed  Clear to auscultation, no wheezing, no use of accessory muscles   S1 and S2 heard,  no rubs murmurs gallops Abdomen soft, nontender, nondistended, bowel sounds present Awake , alert and oriented. No focal neurological deficits. Symmetric 5 x 5 power. No bruising over left hip or lower back. Painful range of movements in bilateral hips. Judgement and insight appear normal.  Mood & affect flat.    Data Reviewed: I have personally reviewed following labs and imaging studies  CBC:  Recent Labs Lab 01/03/17 1300  01/05/17 0425 01/06/17 0337 01/07/17 0421 01/08/17 0411 01/09/17 0635 01/10/17 0500  WBC 36.9*  < > 22.5* 17.6* 17.5* 20.1* 24.8* 18.9*  NEUTROABS 33.6*  --  19.0* 14.0* 13.1* 15.3*  --   --   HGB 8.3*  < > 8.3* 7.7* 7.6* 7.0* 10.2* 9.8*  HCT 26.2*  < > 24.8* 23.5* 23.3* 21.6* 30.6* 30.2*  MCV  75.1*  < > 74.0* 75.1* 76.4* 76.9* 78.9 77.8*  PLT 456*  < > 309 175 148* 178 246 366  < > = values in this interval not displayed. Basic Metabolic Panel:  Recent Labs Lab 01/04/17 2357 01/05/17 0425 01/06/17 0337 01/07/17 0421 01/08/17 0411  NA 134* 135 135 134* 133*  K 3.4* 5.1 3.3* 3.7 3.6  CL 109 110 105 104 105  CO2 15* 16* 25 25 21*  GLUCOSE 93 85 119* 108* 105*  BUN 26* 22* 12 8 6   CREATININE 1.09* 1.01* 0.62 0.52 0.52  CALCIUM 7.3* 7.4* 7.1* 7.1* 6.8*   Liver Function Tests:  Recent Labs Lab 01/04/17 0805 01/06/17 0337  AST 176* 84*  ALT 75* 50  ALKPHOS 342* 258*  BILITOT 2.0* 0.7  PROT 4.5* 4.2*  ALBUMIN 1.5* 1.4*   Coagulation Profile:  Recent Labs Lab 01/03/17 1730 01/06/17 0337  INR 1.74 1.34   Cardiac Enzymes: No results for input(s): CKTOTAL, CKMB, CKMBINDEX, TROPONINI in the last 168 hours. HbA1C: No results for input(s): HGBA1C in the last 72 hours. CBG:  Recent Labs Lab 01/04/17 1634 01/04/17 2242 01/05/17 0751 01/05/17 1147 01/05/17 1716  GLUCAP 109* 91 76 82 90    Recent Results (from the past 240 hour(s))  Urine culture     Status: Abnormal   Collection Time: 01/03/17  3:45 PM  Result Value Ref Range Status   Specimen Description URINE, CATHETERIZED  Final   Special Requests NONE  Final   Culture (A)  Final    >=100,000 COLONIES/mL ESCHERICHIA COLI Confirmed Extended Spectrum Beta-Lactamase Producer (ESBL)    Report Status 01/05/2017 FINAL  Final   Organism ID, Bacteria ESCHERICHIA COLI (A)  Final      Susceptibility   Escherichia coli - MIC*    AMPICILLIN >=32 RESISTANT Resistant     CEFAZOLIN >=64 RESISTANT Resistant     CEFTRIAXONE >=64 RESISTANT Resistant     CIPROFLOXACIN >=4 RESISTANT Resistant     GENTAMICIN <=1 SENSITIVE Sensitive     IMIPENEM <=0.25 SENSITIVE Sensitive     NITROFURANTOIN >=512 RESISTANT Resistant     TRIMETH/SULFA <=20 SENSITIVE Sensitive     AMPICILLIN/SULBACTAM >=32 RESISTANT Resistant      PIP/TAZO <=4 SENSITIVE Sensitive     Extended ESBL POSITIVE Resistant     * >=100,000 COLONIES/mL ESCHERICHIA COLI  MRSA PCR Screening     Status: Abnormal   Collection Time: 01/07/17  9:08 PM  Result Value Ref Range Status   MRSA by PCR POSITIVE (A) NEGATIVE Final    Comment:        The GeneXpert MRSA Assay (FDA approved for NASAL specimens only), is one component of a comprehensive MRSA colonization surveillance program. It is not intended to diagnose MRSA infection nor to guide or monitor treatment for MRSA infections. RESULT CALLED TO, READ BACK BY AND VERIFIED WITH: Meda Klinefelter, RN 2252 01/07/17 T. Hedwig Morton  Radiology Studies: Mr Hip Left Wo Contrast  Result Date: 01/09/2017 CLINICAL DATA:  Left hip pain. History of fall 01/01/2017. Initial encounter. EXAM: MR OF THE LEFT HIP WITHOUT CONTRAST TECHNIQUE: Multiplanar, multisequence MR imaging was performed. No intravenous contrast was administered. COMPARISON:  Plain film of the pelvis 01/03/2017. FINDINGS: Bones: There is marrow edema in the right and left sacrum due to bilateral sacral fractures. The patient also has a right inferior pubic ramus fracture and right pubic bone fracture. No other fracture is identified. Both hips are located. There is no avascular necrosis of the femoral heads. Articular cartilage and labrum Articular cartilage: Evaluation is somewhat limited by patient motion but only mild degenerative change is seen. Labrum:  Appears intact. Joint or bursal effusion Joint effusion:  None. Bursae:  Negative. Muscles and tendons Muscles and tendons: Increased T2 signal is seen in all imaged musculature. No muscle or tendon tear is identified. Other findings Miscellaneous: Diffuse subcutaneous edema is present. There is a small volume of free pelvic fluid. IMPRESSION: Findings consistent with acute or subacute bilateral sacral fractures and right inferior pubic ramus and right pubic bone fractures. Negative for hip  fracture. Marked body wall edema with a small volume of free pelvic fluid and intramuscular edema likely due to volume overload. Electronically Signed   By: Drusilla Kannerhomas  Dalessio M.D.   On: 01/09/2017 08:17        Scheduled Meds: . cyclobenzaprine  5 mg Oral TID  . feeding supplement (ENSURE ENLIVE)  237 mL Oral TID BM  . heparin  5,000 Units Subcutaneous Q8H  . lidocaine  2 patch Transdermal Daily  . linaclotide  145 mcg Oral QAC breakfast  . lipase/protease/amylase  36,000 Units Oral TID WC  . metoprolol tartrate  25 mg Oral BID  . mirtazapine  30 mg Oral QHS  . mometasone-formoterol  2 puff Inhalation BID  . multivitamin with minerals  1 tablet Oral Daily  . oxyCODONE  40 mg Oral Q12H  . pantoprazole  40 mg Oral Daily  . polyethylene glycol  17 g Oral Daily  . senna  1 tablet Oral Daily  . venlafaxine XR  37.5 mg Oral Q breakfast   Continuous Infusions: . sodium chloride    . meropenem (MERREM) IV Stopped (01/10/17 0951)     LOS: 7 days     Huey BienenstockELGERGAWY, DAWOOD, MD [facility] Pager [phone]  If 7PM-7AM, please contact night-coverage [email or web address] Password [email or web address] 01/10/2017, 11:59 AM

## 2017-01-10 NOTE — Progress Notes (Signed)
Patient refused to get out of bed for both breakfast and lunch stating " pain is too bad in my legs", pain medication given at both time and multiple offers made to patient.

## 2017-01-10 NOTE — Progress Notes (Addendum)
Nutrition Follow-up  DOCUMENTATION CODES:   Underweight, Severe malnutrition in context of chronic illness  INTERVENTION:   -Ensure Enlive po TID with meals, each supplement provides 350 kcal and 20 grams of protein  NUTRITION DIAGNOSIS:   Malnutrition (Severe) related to chronic illness (COPD) as evidenced by severe depletion of body fat, moderate depletion of body fat, moderate depletions of muscle mass, severe depletion of muscle mass.  Ongoing  GOAL:   Patient will meet greater than or equal to 90% of their needs  Progressing  MONITOR:   PO intake, Supplement acceptance, Diet advancement, Labs, Weight trends, Skin, I & O's  REASON FOR ASSESSMENT:   Consult Assessment of nutrition requirement/status  ASSESSMENT:   Vanessa Massey is a 71 y.o. female who presents with  Sepsis from UTI/pyelo. Brief hypoxic episode likely from being ill and resting in bed w/ poor positioning in the setting of COPD.    Intake remains very poor; noted meal completion 0%. Pt has been refusing Ensure supplements, however, accepted two supplements today.  Medications reviewed Creon and remeron added on 01/08/17.   Palliative care following pt; plan to resume hospice services once returning to SNF.   Labs reviewed.   Diet Order:  Diet regular Room service appropriate? Yes; Fluid consistency: Thin  Skin:  Reviewed, no issues  Last BM:  01/08/17  Height:   Ht Readings from Last 1 Encounters:  01/03/17 4\' 11"  (1.499 m)    Weight:   Wt Readings from Last 1 Encounters:  01/08/17 96 lb (43.5 kg)    Ideal Body Weight:  44.5 kg  BMI:  Body mass index is 19.39 kg/m.  Estimated Nutritional Needs:   Kcal:  1200-1400  Protein:  65-80 grams  Fluid:  1.2-1.4 L  EDUCATION NEEDS:   Education needs addressed  Vanessa Massey A. Mayford KnifeWilliams, RD, LDN, CDE Pager: [phone] After hours Pager: [phone]

## 2017-01-10 NOTE — Clinical Social Work Note (Signed)
Clinical Social Work Assessment  Patient Details  Name: Vanessa Massey MRN: [id number] Date of Birth: 21-Sep-1945  Date of referral:  01/10/17               Reason for consult:  Facility Placement, Discharge Planning, [facility]                Permission sought to share information with:  Facility Medical sales representativeContact Representative, Sports coachCase Manager, Family Supports Permission granted to share information::  Yes, Verbal Permission Granted  Name::        Agency::     Relationship::  Junious DresserConnie patient daughter  SolicitorContact Information:     Housing/Transportation Living arrangements for the past 2 months:  Apartment Source of Information:  Patient, Medical Team, Sports coachCase Manager, Adult Children Patient Interpreter Needed:  None Criminal Activity/Legal Involvement Pertinent to Current Situation/Hospitalization:  No - Comment as needed Significant Relationships:  Adult Children, Other Family Members, Community Support Lives with:  Self Do you feel safe going back to the place where you live?  No Need for family participation in patient care:  Yes (Comment)  Care giving concerns:  Patient reports she fell at home which caused admission and has hip fractures.  Daughter at the bedside and participated in assessment.  Reports back in February she was placed in a nursing facility [facility] and completed 3 months and transitioned home with her brother.  Reports she graduated from hospice as she became more independent with ADLs, did her own cooking and cleaning and overall improved in her medical health that she decided to return home to her senior living apartment.  She was at apartment for one week, fell and [facility].  Patient at this time cannot walk nor care for herself and daughter reports surgery is not an option due to a bone disease, thus they are seeking her to go back to facility for assistance, rehab and care.  Overall they were very pleased with care at [facility] where she completed her last  admission and want to return.  Her PCP is the medical director at the facility and they have been in contact to return.  Social Worker assessment / plan:  Assessment completed regarding consult for disposition planning and SNF placement.  SNF work up completed and [facility]  LCSW will initiate insurance authorization as [employer] requires pre-auth.  LCSW will follow up with daughter regarding plans as she reports MD stated possibly tomorrow for discharge.  Employment status:  Retired, Disabled (Comment on whether or not currently receiving Disability) Insurance information:  Teacher, English as a foreign languageManaged Medicare PT Recommendations:  Skilled Nursing Facility Information / Referral to community resources:  Skilled Nursing Facility  Patient/Family's Response to care:  Understanding  Patient/Family's Understanding of and Emotional Response to Diagnosis, Current Treatment, and Prognosis:  Patient able to explain reason for admission and need for ST rehab as she is unable to manage at home alone and not walking.    Emotional Assessment Appearance:  Appears older than stated age Attitude/Demeanor/Rapport:    Affect (typically observed):  Accepting, Adaptable, Pleasant Orientation:  Oriented to Self, Oriented to Place, Oriented to Situation Alcohol / Substance use:  Not Applicable Psych involvement (Current and /or in the community):  No (Comment)  Discharge Needs  Concerns to be addressed:  No discharge needs identified Readmission within the last 30 days:  No Current discharge risk:  None Barriers to Discharge:  English as a second language teachernsurance Authorization, Continued Medical Work up   Raye SorrowCoble, Leeta Grimme N, LCSW 01/10/2017, 11:04  AM

## 2017-01-11 DIAGNOSIS — A4151 Sepsis due to Escherichia coli [E. coli]: Principal | ICD-10-CM

## 2017-01-11 DIAGNOSIS — S329XXD Fracture of unspecified parts of lumbosacral spine and pelvis, subsequent encounter for fracture with routine healing: Secondary | ICD-10-CM

## 2017-01-11 LAB — CBC
HEMATOCRIT: 30.7 % — AB (ref 36.0–46.0)
Hemoglobin: 9.7 g/dL — ABNORMAL LOW (ref 12.0–15.0)
MCH: 25.7 pg — ABNORMAL LOW (ref 26.0–34.0)
MCHC: 31.6 g/dL (ref 30.0–36.0)
MCV: 81.2 fL (ref 78.0–100.0)
PLATELETS: 461 10*3/uL — AB (ref 150–400)
RBC: 3.78 MIL/uL — ABNORMAL LOW (ref 3.87–5.11)
RDW: 19.3 % — AB (ref 11.5–15.5)
WBC: 21.2 10*3/uL — ABNORMAL HIGH (ref 4.0–10.5)

## 2017-01-11 MED ORDER — SENNA 8.6 MG PO TABS: 1.0000 | ORAL_TABLET | Freq: Every day | ORAL | 0 refills | Status: AC

## 2017-01-11 MED ORDER — SULFAMETHOXAZOLE-TRIMETHOPRIM 800-160 MG PO TABS: 1.0000 | ORAL_TABLET | Freq: Two times a day (BID) | ORAL | Status: AC

## 2017-01-11 MED ORDER — PANCRELIPASE (LIP-PROT-AMYL) 36000-114000 UNITS PO CPEP: 36000.0000 [IU] | ORAL_CAPSULE | Freq: Three times a day (TID) | ORAL | Status: AC

## 2017-01-11 MED ORDER — ADULT MULTIVITAMIN W/MINERALS CH: 1.0000 | ORAL_TABLET | Freq: Every day | ORAL | Status: AC

## 2017-01-11 MED ORDER — PANTOPRAZOLE SODIUM 40 MG PO TBEC: 40.0000 mg | DELAYED_RELEASE_TABLET | Freq: Two times a day (BID) | ORAL | Status: AC

## 2017-01-11 MED ORDER — ENSURE ENLIVE PO LIQD: 237.0000 mL | Freq: Three times a day (TID) | ORAL | 12 refills | Status: AC

## 2017-01-11 MED ORDER — OXYCODONE HCL 10 MG PO TABS: 10.0000 mg | ORAL_TABLET | ORAL | 0 refills | Status: AC | PRN

## 2017-01-11 MED ORDER — METOPROLOL TARTRATE 25 MG PO TABS: 25.0000 mg | ORAL_TABLET | Freq: Two times a day (BID) | ORAL | Status: AC

## 2017-01-11 MED ORDER — SULFAMETHOXAZOLE-TRIMETHOPRIM 800-160 MG PO TABS
1.0000 | ORAL_TABLET | Freq: Two times a day (BID) | ORAL | Status: DC
  Administered 2017-01-11: 1 via ORAL
  Filled 2017-01-11: qty 1

## 2017-01-11 MED ORDER — MUPIROCIN 2 % EX OINT: 1.0000 "application " | TOPICAL_OINTMENT | Freq: Two times a day (BID) | CUTANEOUS | 0 refills | Status: AC

## 2017-01-11 MED ORDER — OXYCODONE HCL ER 40 MG PO T12A: 40.0000 mg | EXTENDED_RELEASE_TABLET | Freq: Two times a day (BID) | ORAL | 0 refills | Status: AC

## 2017-01-11 MED ORDER — POLYETHYLENE GLYCOL 3350 17 G PO PACK: 17.0000 g | PACK | Freq: Every day | ORAL | 0 refills | Status: AC | PRN

## 2017-01-11 NOTE — Discharge Summary (Signed)
Vanessa Massey, is a 71 y.o. female  DOB 19-Aug-1945  MRN [id number].  Admission date:  01/03/2017  Admitting Physician  Ozella Rocksavid J Merrell, MD  Discharge Date:  01/11/2017   Primary MD  Lindwood QuaHoffman, Byron, MD  Recommendations for primary care physician for things to follow:  - Please check CBC, BMP in 3 down to ensure leukocytosis is trending down. - patient  is weight bearing as tolerated given her pelvic fracture. - Palliative to be consulted upon patient's admission to facility - start on aspirin 81 mg oral daily in 1 week of hemoglobin remained stable  CODE STATUS DO NOT RESUSCITATE Admission Diagnosis  Acidosis [E87.2] Dehydration [E86.0] Hyperkalemia [E87.5] Hepatitis [K75.9] Fall [W19.XXXA] AKI (acute kidney injury) (HCC) [N17.9] Acute kidney injury (HCC) [N17.9]   Discharge Diagnosis  Acidosis [E87.2] Dehydration [E86.0] Hyperkalemia [E87.5] Hepatitis [K75.9] Fall [W19.XXXA] AKI (acute kidney injury) (HCC) [N17.9] Acute kidney injury (HCC) [N17.9]    Principal Problem:   Acute respiratory failure (HCC) Active Problems:   Atherosclerosis of native artery of extremity with intermittent claudication (HCC)   Acidosis   Acute kidney injury (HCC)   Dehydration   Hyperkalemia   Sepsis (HCC)   Anemia   Leukocytosis   Hypoglycemia   Hyponatremia   COPD (chronic obstructive pulmonary disease) (HCC)   Fall   AKI (acute kidney injury) (HCC)   Acute pyelonephritis   Protein-calorie malnutrition, severe   Palliative care by specialist   Advance care planning   Goals of care, counseling/discussion   Sacral fracture (HCC)   Pelvic fracture (HCC)      Past Medical History:  Diagnosis Date  . Acidosis   . Acute kidney injury (HCC)   . Arthritis   . Asthma   . COPD (chronic obstructive pulmonary disease) (HCC) 01/03/2017  . Dehydration   . Headache(784.0)   . Heel pain   . Hepatitis     . Ulcer     Past Surgical History:  Procedure Laterality Date  . CHOLECYSTECTOMY  1971  . COLON SURGERY    . PANCREAS SURGERY  1999  . PARTIAL GASTRECTOMY  1999       History of present illness and  Hospital Course:     Kindly see H&P for history of present illness and admission details, please review complete Labs, Consult reports and Test reports for all details in brief  HPI  from the history and physical done on the day of admission 01/03/2017 HPI: Vanessa MakiJudy F Housel is a 71 y.o. female with medical history significant for arthritis, asthma, degenerative disc disease hepatitis presents to the emergency department Chief complaint generalized weakness persistent low back pain and left leg pain. Initial evaluation reveals a lactic acidosis, acute kidney injury, hyperkalemia and leukocytosis and hypoglycemia.  Information is obtained from the chart and the patient. She states that 2 days ago she suffered a mechanical fall. She denies having any difficulty getting up off the ground. She states since that time she has been "hurting all over". She also has chronic  pain chronic low back pain in her home medications include oxycodone and Naprosyn. She states she uses a walker at home and has not had any recent falls. She denies headache dizziness syncope or near-syncope. She denies fever chills chest pain palpitations cough lower extremity edema. She does endorse some nausea without vomiting. She denies dysuria hematuria frequency or urgency. She does endorse decreased oral intake over the last 2 days.     ED Course: In emergency department she's afebrile hemodynamically stable heart rate 96 respiratory rate 24. Oxygen saturation level 86% in ED. She received IV fluids antibiotics and 1 amp of [facility]   Hospital Course   71 year old female, lives alone, ambulates with the help of a walker, PMH of asthma/COPD not on home oxygen, told to have hepatitis at age 58, status post partial  gastrectomy and pancreas surgery, depression, chronic low back pain, sustained a mechanical fall 2 days prior to admission when she was ambulating to the bathroom without her walker, denies any injuries, bleeding or LOC, able to get up by herself, presents with worsening low back pain, left hip pain. She had been seen at [facility] but no imaging studies performed. Home oxycodone did not provide relief. Also reported weakness but no dizziness or lightheadedness. No dysuria, urinary frequency, fever or chills. 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 Admitted for further management. New onset A. fib with RVR on 6/14.Confirmed ESBL Escherichia coli UTI. PAF with CVR. Ongoing left hip pain >Unable to do MRI hip on 6/16. As per daughter, beginning of this year was in SNF for 3 months, then discharged to patient's brother's home, was on home hospice-able to come off, and returned home a week ago. Palliative care consulted for goals of care. Left hip pain better. Trying MRI of left hip today. FOBT +. Hemoglobin again has gradually dropped down to 7. [facility] consulted. Patient was not a candidate for procedure.   ESBL Escherichia coli UTI:  -Patient denies urinary symptoms but urine microscopy and emphysematous cystitis seen on lumbar spine x-ray are concerning. No sepsis on admission.  - Urine culture growing ESBL, Rocephin changed to meropenem on 6/15 , she received total of 7 days of IV meropenem during hospital stay, she is afebrile, nontoxic appearing, but continues to have significant leukocytosis at 20 1K, this discussed with ID dr Wess Botts, given patient's she is clinically improving no need to worry about her leukocytosis, plan to continue on another 7 days of Bactrim DS twice a day to finish total of 14 days treatment.  A. fib with RVR,  new onset:  - Now PAF w CVR. Continue metoprolol 25 MG PO BID. 2-D echo with a preserved EF 55-60%, no regional wall motion abnormality, and with evidence of diastolic dysfunction . - She is not an anticoagulation candidate secondary to multiple falls, as well secondary to her anemia and evidence of GI bleed . - Can be started on aspirin in 1 week if her hemoglobin remains stable.  Acute/subacute sacral fracture, pelvic fracture - MRI left hip 6/18 showing Findings consistent with acute or subacute bilateral sacral fractures and right inferior pubic ramus and right pubic bone fractures. Negative for hip fracture. - Discussed with orthopedic Dr. Magnus Ivan, she is not a surgical candidate, continue with supportive care, pain control, and weight bearing as tolerated. Seen by PT, will need premedication prior to physical therapy,  accommodation for SNF placement by PT. - continue with pain medication, current regimen. Palliative care recommendation, MS Contin 40 mg twice a day, and when necessary oxycodone 10 mg every 4 hours.  Transient hypoxia/acute respiratory failure with hypoxia:  - She was 86% on room air in the ED. May have been related to acute illness complicating COPD. No clinical bronchospasm. Chest x-ray without acute findings. Resolved. Stable.  COPD:  - When necessary broncho-dilator nebulizations. No clinical bronchospasm. Stable.  Acute kidney injury:  - Resolved.  Hyperkalemia/hypokalemia: - Hyperkalemia resolved. Aldactone on hold. Replaced  Non-anion gap metabolic acidosis: - Unclear etiology. No GI losses reported.? RTA 4-? Etiology. She had bicarbonate of 10 on admission. Resolved after IV bicarbonate drip.   Dehydration with hyponatremia: -  Resolved. At high risk for recurrent dehydration due to inconsistent oral intake.  Chronic microcytic anemia:  - Presented with hemoglobin of 8.3 which dropped to 6.6 after hydration. He received 1 unit PRBC on 6/14, and 6/18,  since then hemoglobin is stable, it is 9.7 today .  GI bleed  - GI  input greatly appreciated, at this point no EGD/colonoscopy is indicated as she cannot lay still for procedure, as well patient has declined EGD/colonoscopy, continue with supportive care, transfuse as needed, IV iron as needed (will avoid IV iron currently in the setting of active infection ) - She is not a candidate for anticoagulation  Hypoglycemia: - Probably due to poor oral intake. Serum glucose on presentation <20. Monitor CBGs closely. Resolved.  Elevated INR/abnormal LFTs/hepatitis history: - ? Cirrhosis. Acute hepatitis panel: Negative and HIV status.: Nonreactive.? Nutritional. INR down to 1.34.  Leukocytosis: - ? Related to acute infectious etiology versus others. Review of labs in care everywhere shows that she has chronic leukocytosis at least since 2015etiology of this is unclear. Check peripheral smear. Trend CBCs for improvement and may need to consider outpatient hematology consultation.  overall leukocytosis trending down since admission.  Severe protein calorie malnutrition: - Continue with supplements on discharge  Adult failure to thrive:  - Probably multifactorial related to age and multiple severe comorbidities. Palliative care consultation and follow-up appreciated. - Patient will need palliative medicine consulted upon admission to facility, she is a hospice candidate  Depression: Continue venlafaxine. No suicidal or homicidal ideations reported.  Severe weight loss: Reported by patient's daughter on 6/16. Apparently was released from hospice. Etiology felt to be due to poor oral intake. Outpatient evaluation as deemed necessary.  Chronic pancreatitis: Based on pancreatic supplements PTA. Could also because of severe weight loss. No diarrhea. GI increasing Creon.    Discharge Condition:  Stable - Will need Palliative consultation at SNF   Follow UP  Contact information for  after-discharge care    Destination    [facility].   Specialty:  Skilled Nursing Facility Contact information: [address] [place]. [place] [address] [phone]                Discharge Instructions  and  Discharge Medications    Discharge Instructions    Discharge instructions    Complete by:  As directed    Follow with Primary MD Lindwood Qua, MD  After discharge from SNF, meanwhile to be seen by SNF physician  Get CBC, CMP, 2 view Chest X ray checked  by Primary MD next visit.    Activity: As tolerated with Full fall precautions use walker/cane & assistance as needed   Disposition SNF   Diet: Regular  fluid , with feeding assistance and aspiration precautions.  For Heart failure patients - Check your Weight same time everyday, if you gain over 2 pounds, or you develop in leg swelling, experience more shortness of breath or chest pain, call your Primary MD immediately. Follow Cardiac Low Salt Diet and 1.5 lit/day fluid restriction.   On your next visit with your primary care physician please Get Medicines reviewed and adjusted.   Please request your Prim.MD to go over all Hospital Tests and Procedure/Radiological results at the follow up, please get all Hospital records sent to your Prim MD by signing hospital release before you go home.   If you experience worsening of your admission symptoms, develop shortness of breath, life threatening emergency, suicidal or homicidal thoughts you must seek medical attention immediately by calling 911 or calling your MD immediately  if symptoms less severe.  You Must read complete instructions/literature along with all the possible adverse reactions/side effects for all the Medicines you take and that have been prescribed to you. Take any new Medicines after you have completely understood and accpet all the possible adverse reactions/side effects.   Do not drive, operating heavy  machinery, perform activities at heights, swimming or participation in water activities or provide baby sitting services if your were admitted for syncope or siezures until you have seen by Primary MD or a Neurologist and advised to do so again.  Do not drive when taking Pain medications.    Do not take more than prescribed Pain, Sleep and Anxiety Medications  Special Instructions: If you have smoked or chewed Tobacco  in the last 2 yrs please stop smoking, stop any regular Alcohol  and or any Recreational drug use.  Wear Seat belts while driving.   Please note  You were cared for by a hospitalist during your hospital stay. If you have any questions about your discharge medications or the care you received while you were in the hospital after you are discharged, you can call the unit and asked to speak with the hospitalist on call if the hospitalist that took care of you is not available. Once you are discharged, your primary care physician will handle any further medical issues. Please note that NO REFILLS for any discharge medications will be authorized once you are discharged, as it is imperative that you return to your primary care physician (or establish a relationship with a primary care physician if you do not have one) for your aftercare needs so that they can reassess your need for medications and monitor your lab values.   Increase activity slowly    Complete by:  As directed      Allergies as of 01/11/2017      Reactions   Bee Pollen Shortness Of Breath   Morphine And Related Nausea And Vomiting   Varenicline Other (See Comments)   Nightmares, Nausea,depressive thoughts   Codeine Nausea Only   Neomycin Rash      Medication List    STOP taking these medications   naproxen 500 MG tablet Commonly known as:  NAPROSYN   omeprazole 20 MG capsule Commonly known as:  PRILOSEC Replaced by:  pantoprazole 40 MG tablet     TAKE these medications   albuterol 108 (90 Base)  MCG/ACT inhaler Commonly known as:  PROVENTIL HFA;VENTOLIN HFA Inhale 1 puff into the lungs every 6 (six) hours as needed for wheezing or shortness of breath.   budesonide-formoterol 160-4.5 MCG/ACT inhaler Commonly known as:  SYMBICORT Inhale 2 puffs  into the lungs 2 (two) times daily.   feeding supplement (ENSURE ENLIVE) Liqd Take 237 mLs by mouth 3 (three) times daily with meals.   LINZESS 145 MCG Caps capsule Generic drug:  linaclotide Take 145 mcg by mouth daily before breakfast.   lipase/protease/amylase 16109 UNITS Cpep capsule Commonly known as:  CREON Take 1 capsule (36,000 Units total) by mouth 3 (three) times daily with meals. What changed:  medication strength  how much to take  when to take this   loperamide 2 MG capsule Commonly known as:  IMODIUM Take 2 mg by mouth as needed for diarrhea or loose stools.   magnesium oxide 400 MG tablet Commonly known as:  MAG-OX Take 400 mg by mouth 2 (two) times daily.   metoprolol tartrate 25 MG tablet Commonly known as:  LOPRESSOR Take 1 tablet (25 mg total) by mouth 2 (two) times daily.   mirtazapine 15 MG tablet Commonly known as:  REMERON Take 15 mg by mouth at bedtime.   multivitamin with minerals Tabs tablet Take 1 tablet by mouth daily. Start taking on:  01/12/2017   mupirocin ointment 2 % Commonly known as:  BACTROBAN Place 1 application into the nose 2 (two) times daily. Apply for 5 days then stop   ondansetron 4 MG tablet Commonly known as:  ZOFRAN Take 8 mg by mouth every 8 (eight) hours as needed for nausea or vomiting.   Oxycodone HCl 10 MG Tabs Take 1 tablet (10 mg total) by mouth every 4 (four) hours as needed for breakthrough pain (pain). What changed:  medication strength  how much to take  reasons to take this   oxyCODONE 40 mg 12 hr tablet Commonly known as:  OXYCONTIN Take 1 tablet (40 mg total) by mouth every 12 (twelve) hours. What changed:  You were already taking a medication  with the same name, and this prescription was added. Make sure you understand how and when to take each.   pantoprazole 40 MG tablet Commonly known as:  PROTONIX Take 1 tablet (40 mg total) by mouth 2 (two) times daily. Replaces:  omeprazole 20 MG capsule   polyethylene glycol packet Commonly known as:  MIRALAX / GLYCOLAX Take 17 g by mouth daily as needed.   Potassium 99 MG Tabs Take 1 tablet by mouth daily.   senna 8.6 MG Tabs tablet Commonly known as:  SENOKOT Take 1 tablet (8.6 mg total) by mouth daily. Start taking on:  01/12/2017   spironolactone 25 MG tablet Commonly known as:  ALDACTONE Take 25 mg by mouth daily.   sulfamethoxazole-trimethoprim 800-160 MG tablet Commonly known as:  BACTRIM DS,SEPTRA DS Take 1 tablet by mouth every 12 (twelve) hours. Please take for 7 days then stop   venlafaxine XR 37.5 MG 24 hr capsule Commonly known as:  EFFEXOR-XR Take 37.5 mg by mouth daily with breakfast.         Diet and Activity recommendation: See Discharge Instructions above   Consults obtained -  GI Palliative    Major procedures and Radiology Reports - PLEASE review detailed and final reports for all details, in brief -     Dg Lumbar Spine Complete  Result Date: 01/03/2017 CLINICAL DATA:  Fall Monday with continued lower back pain. Initial encounter. EXAM: LUMBAR SPINE - COMPLETE 4+ VIEW COMPARISON:  None. FINDINGS: Mild superior endplate irregularity at L4, chronic appearing and with accentuated spurring. No acute fracture suspected. Mild disc narrowing at L3-4. L4-5 and L5-S1 mild facet spurring. Osteopenia. Nonspecific  L1-2 disc calcification. Extensive atherosclerotic calcification. Linear, scar-like opacity seen over the anterior lung. These results were called by telephone at the time of interpretation on 01/03/2017 at 11:57 am to Dr. Sharilyn Sites , who verbally acknowledged these results. IMPRESSION: 1. Findings of emphysematous cystitis. 2. No acute finding  in the lumbar spine. Electronically Signed   By: Marnee Spring M.D.   On: 01/03/2017 11:58   Dg Pelvis 1-2 Views  Result Date: 01/03/2017 CLINICAL DATA:  Less post fall 2 days ago with persistent left groin and lateral femur pain. Also tingling in the left foot. EXAM: PELVIS - 1-2 VIEW COMPARISON:  None in PACs FINDINGS: The bones are subjectively osteopenic. No acute pelvic fracture is observed. There is no lytic or blastic pelvic lesion. The hip joint spaces are reasonably well maintained. The articular surfaces of the femoral heads and acetabuli remains smoothly rounded. IMPRESSION: No acute fracture of the pelvis is observed. Electronically Signed   By: David  [place] M.D.   On: 01/03/2017 11:56   Mr Hip Left Wo Contrast  Result Date: 01/09/2017 CLINICAL DATA:  Left hip pain. History of fall 01/01/2017. Initial encounter. EXAM: MR OF THE LEFT HIP WITHOUT CONTRAST TECHNIQUE: Multiplanar, multisequence MR imaging was performed. No intravenous contrast was administered. COMPARISON:  Plain film of the pelvis 01/03/2017. FINDINGS: Bones: There is marrow edema in the right and left sacrum due to bilateral sacral fractures. The patient also has a right inferior pubic ramus fracture and right pubic bone fracture. No other fracture is identified. Both hips are located. There is no avascular necrosis of the femoral heads. Articular cartilage and labrum Articular cartilage: Evaluation is somewhat limited by patient motion but only mild degenerative change is seen. Labrum:  Appears intact. Joint or bursal effusion Joint effusion:  None. Bursae:  Negative. Muscles and tendons Muscles and tendons: Increased T2 signal is seen in all imaged musculature. No muscle or tendon tear is identified. Other findings Miscellaneous: Diffuse subcutaneous edema is present. There is a small volume of free pelvic fluid. IMPRESSION: Findings consistent with acute or subacute bilateral sacral fractures and right inferior pubic ramus  and right pubic bone fractures. Negative for hip fracture. Marked body wall edema with a small volume of free pelvic fluid and intramuscular edema likely due to volume overload. Electronically Signed   By: Drusilla Kanner M.D.   On: 01/09/2017 08:17   Dg Chest Port 1 View  Result Date: 01/03/2017 CLINICAL DATA:  Fall.  Elevated white count. EXAM: PORTABLE CHEST 1 VIEW COMPARISON:  None. FINDINGS: The cardiomediastinal silhouette is unremarkable. Minimal mid right lung subsegmental scar/atelectasis or minor fissure pleural thickening is noted. There is no evidence of focal airspace disease, pulmonary edema, suspicious pulmonary nodule/mass, pleural effusion, or pneumothorax. No acute bony abnormalities are identified. IMPRESSION: No evidence of acute abnormality. Electronically Signed   By: Harmon Pier M.D.   On: 01/03/2017 14:06   Dg Femur Min 2 Views Left  Result Date: 01/03/2017 CLINICAL DATA:  Status post fall.  Left hip pain. EXAM: LEFT FEMUR 2 VIEWS COMPARISON:  None. FINDINGS: There is no evidence of fracture or other focal bone lesions. Soft tissues are unremarkable. Peripheral vascular atherosclerotic disease. IMPRESSION: No acute osseous injury of the left femur. Given the patient's age and osteopenia, if there is persistent clinical concern for an occult hip fracture, a MRI of the hip is recommended for increased sensitivity. Electronically Signed   By: Elige Ko   On: 01/03/2017 11:55  Micro Results    Recent Results (from the past 240 hour(s))  Urine culture     Status: Abnormal   Collection Time: 01/03/17  3:45 PM  Result Value Ref Range Status   Specimen Description URINE, CATHETERIZED  Final   Special Requests NONE  Final   Culture (A)  Final    >=100,000 COLONIES/mL ESCHERICHIA COLI Confirmed Extended Spectrum Beta-Lactamase Producer (ESBL)    Report Status 01/05/2017 FINAL  Final   Organism ID, Bacteria ESCHERICHIA COLI (A)  Final      Susceptibility   Escherichia  coli - MIC*    AMPICILLIN >=32 RESISTANT Resistant     CEFAZOLIN >=64 RESISTANT Resistant     CEFTRIAXONE >=64 RESISTANT Resistant     CIPROFLOXACIN >=4 RESISTANT Resistant     GENTAMICIN <=1 SENSITIVE Sensitive     IMIPENEM <=0.25 SENSITIVE Sensitive     NITROFURANTOIN >=512 RESISTANT Resistant     TRIMETH/SULFA <=20 SENSITIVE Sensitive     AMPICILLIN/SULBACTAM >=32 RESISTANT Resistant     PIP/TAZO <=4 SENSITIVE Sensitive     Extended ESBL POSITIVE Resistant     * >=100,000 COLONIES/mL ESCHERICHIA COLI  MRSA PCR Screening     Status: Abnormal   Collection Time: 01/07/17  9:08 PM  Result Value Ref Range Status   MRSA by PCR POSITIVE (A) NEGATIVE Final    Comment:        The GeneXpert MRSA Assay (FDA approved for NASAL specimens only), is one component of a comprehensive MRSA colonization surveillance program. It is not intended to diagnose MRSA infection nor to guide or monitor treatment for MRSA infections. RESULT CALLED TO, READ BACK BY AND VERIFIED WITH: Meda Klinefelter, RN 2252 01/07/17 T. TYSOR        Today   Subjective:   Freddi Starr today Reports she is feeling better, no nausea or vomiting, appetite is picking up, reports her back and hip pain is controlled on current regimen  .  Objective:   Blood pressure (!) 109/54, pulse 90, temperature 99.2 F (37.3 C), resp. rate 18, height 4\' 11"  (1.499 m), weight 43.5 kg (96 lb), SpO2 99 %.   Intake/Output Summary (Last 24 hours) at 01/11/17 1025 Last data filed at 01/11/17 0458  Gross per 24 hour  Intake                0 ml  Output              550 ml  Net             -550 ml    Exam Small built, frail, chronically ill-looking middle-aged female lying comfortably supine in bed  Clear to auscultation, no wheezing, no use of accessory muscles   S1 and S2 heard,  no rubs murmurs gallops Abdomen soft, nontender, nondistended, bowel sounds present Awake , alert and oriented. No focal neurological deficits. Symmetric  5 x 5 power. No bruising over left hip or lower back.  Judgement and insight appear normal. Mood & affect flat.   Data Review   CBC w Diff:  Lab Results  Component Value Date   WBC 21.2 (H) 01/11/2017   HGB 9.7 (L) 01/11/2017   HCT 30.7 (L) 01/11/2017   PLT 461 (H) 01/11/2017   LYMPHOPCT 16 01/08/2017   MONOPCT 6 01/08/2017   EOSPCT 2 01/08/2017   BASOPCT 0 01/08/2017    CMP:  Lab Results  Component Value Date   NA 133 (L) 01/08/2017   K 3.6 01/08/2017  CL 105 01/08/2017   CO2 21 (L) 01/08/2017   BUN 6 01/08/2017   CREATININE 0.52 01/08/2017   PROT 4.2 (L) 01/06/2017   ALBUMIN 1.4 (L) 01/06/2017   BILITOT 0.7 01/06/2017   ALKPHOS 258 (H) 01/06/2017   AST 84 (H) 01/06/2017   ALT 50 01/06/2017  .   Total Time in preparing paper work, data evaluation and todays exam - 35 minutes  Shaheem Pichon M.D on 01/11/2017 at 10:25 AM  [facility]   Office  [phone]

## 2017-01-11 NOTE — Discharge Instructions (Signed)
Follow with Primary MD Vanessa Massey, [place], MD  charge from SNF, meanwhile to be seen by SNF physician  Get CBC, CMP, 2 view Chest X ray checked  by Primary MD next visit.    Activity: As tolerated with Full fall precautions use walker/cane & assistance as needed   Disposition SNF   Diet: Regular fluid , with feeding assistance and aspiration precautions.  For Heart failure patients - Check your Weight same time everyday, if you gain over 2 pounds, or you develop in leg swelling, experience more shortness of breath or chest pain, call your Primary MD immediately. Follow Cardiac Low Salt Diet and 1.5 lit/day fluid restriction.   On your next visit with your primary care physician please Get Medicines reviewed and adjusted.   Please request your Prim.MD to go over all Hospital Tests and Procedure/Radiological results at the follow up, please get all Hospital records sent to your Prim MD by signing hospital release before you go home.   If you experience worsening of your admission symptoms, develop shortness of breath, life threatening emergency, suicidal or homicidal thoughts you must seek medical attention immediately by calling 911 or calling your MD immediately  if symptoms less severe.  You Must read complete instructions/literature along with all the possible adverse reactions/side effects for all the Medicines you take and that have been prescribed to you. Take any new Medicines after you have completely understood and accpet all the possible adverse reactions/side effects.   Do not drive, operating heavy machinery, perform activities at heights, swimming or participation in water activities or provide baby sitting services if your were admitted for syncope or siezures until you have seen by Primary MD or a Neurologist and advised to do so again.  Do not drive when taking Pain medications.    Do not take more than prescribed Pain, Sleep and Anxiety Medications  Special Instructions:  If you have smoked or chewed Tobacco  in the last 2 yrs please stop smoking, stop any regular Alcohol  and or any Recreational drug use.  Wear Seat belts while driving.   Please note  You were cared for by a hospitalist during your hospital stay. If you have any questions about your discharge medications or the care you received while you were in the hospital after you are discharged, you can call the unit and asked to speak with the hospitalist on call if the hospitalist that took care of you is not available. Once you are discharged, your primary care physician will handle any further medical issues. Please note that NO REFILLS for any discharge medications will be authorized once you are discharged, as it is imperative that you return to your primary care physician (or establish a relationship with a primary care physician if you do not have one) for your aftercare needs so that they can reassess your need for medications and monitor your lab values.

## 2017-01-11 NOTE — Progress Notes (Signed)
Patient will DC to: [facility] Anticipated DC date: 01/11/17 Family notified: Daughter Transport by: Sharin MonsPTAR   Per MD patient ready for DC to [facility]. RN, patient, patient's family, and facility notified of DC. Discharge Summary sent to facility. RN given number for report. DC packet on chart. Ambulance transport requested for patient.   CSW signing off.  Cristobal GoldmannNadia Ajahnae Rathgeber, [place] Clinical Social Worker [phone]

## 2017-01-11 NOTE — Clinical Social Work Placement (Signed)
   CLINICAL SOCIAL WORK PLACEMENT  NOTE  Date:  01/11/2017  Patient Details  Name: Vanessa Massey MRN: [id number] Date of Birth: [birth year]  Clinical Social Work is seeking post-discharge placement for this patient at the Skilled  Nursing Facility level of care (*CSW will initial, date and re-position this form in  chart as items are completed):  Yes   Patient/family provided with [place] Clinical Social Work Department's list of facilities offering this level of care within the geographic area requested by the patient (or if unable, by the patient's family).  Yes   Patient/family informed of their freedom to choose among providers that offer the needed level of care, that participate in Medicare, Medicaid or managed care program needed by the patient, have an available bed and are willing to accept the patient.  Yes   Patient/family informed of [facility]'s ownership interest in [facility] and [facility], as well as of the fact that they are under no obligation to receive care at these facilities.  PASRR submitted to EDS on       PASRR number received on       Existing PASRR number confirmed on 01/09/17     FL2 transmitted to all facilities in geographic area requested by pt/family on 01/09/17     FL2 transmitted to all facilities within larger geographic area on       Patient informed that his/her managed care company has contracts with or will negotiate with certain facilities, including the following:        Yes   Patient/family informed of bed offers received.  [facility]     Physician recommends and patient chooses bed at      [facility] to [facility] on 01/11/17.  Patient to be transferred to facility by PTAR     Patient family notified on 01/11/17 of transfer.  Name of family member notified:  Daughter     PHYSICIAN       Additional Comment:     _______________________________________________ Mearl LatinNadia S Jamari Diana, LCSWA 01/11/2017, 11:16 AM

## 2017-01-11 NOTE — Progress Notes (Signed)
Vanessa Massey to be D/C'd skilled nursing facility per MD order.  Discussed with the patient and all questions fully answered.  VSS, Skin clean, dry and intact without evidence of skin break down, no evidence of skin tears noted. IV catheter discontinued intact. Site without signs and symptoms of complications. Dressing and pressure applied.  D/c education completed with patient/family including follow up instructions, medication list, d/c activities limitations if indicated, with other d/c instructions as indicated by MD - patient able to verbalize understanding, all questions fully answered.   Patient instructed to return to ED, call 911, or call MD for any changes in condition.   Report called to [phone]   Vanessa Massey 01/11/2017 12:18 PM

## 2017-02-21 DEATH — deceased

## 2019-03-29 IMAGING — MR MR HIP*L* W/O CM
4 of 5 series · 19 of 40 positions shown · non-contrast
Comparison: Plain film of the pelvis 01/03/2017.

CLINICAL DATA: Left hip pain. History of fall 01/01/2017. Initial
encounter.

EXAM:
MR OF THE LEFT HIP WITHOUT CONTRAST
TECHNIQUE: Multiplanar, multisequence MR imaging was performed. No intravenous
contrast was administered.

[Series 5: T1 · coronal · 3.5mm · 0.70mm/px · 3 of 39 slices shown (1 of 2)]
[im 5/39]
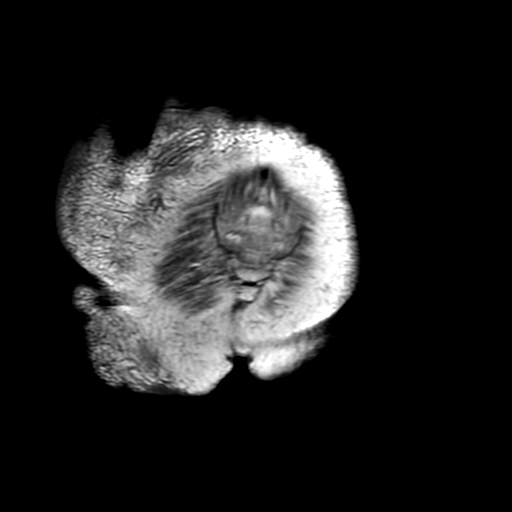
[im 20/39]
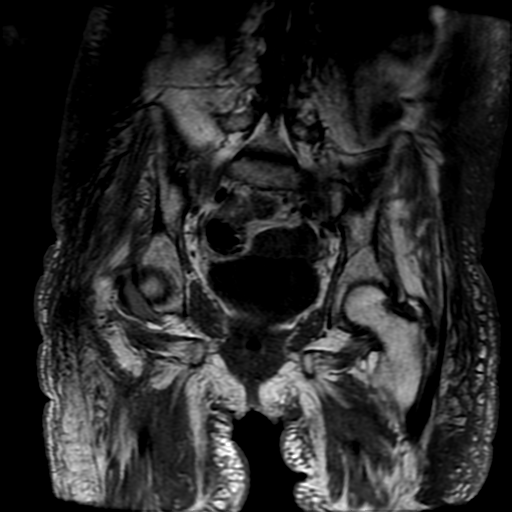
[im 34/39]
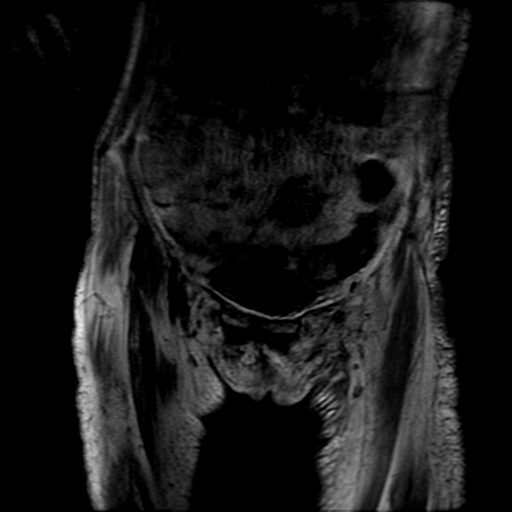

[Series 7: T2 fat-sat · axial · 3.5mm · 0.70mm/px · z∈[+9,+133]mm · 6 of 38 slices shown]
[im 1/38]
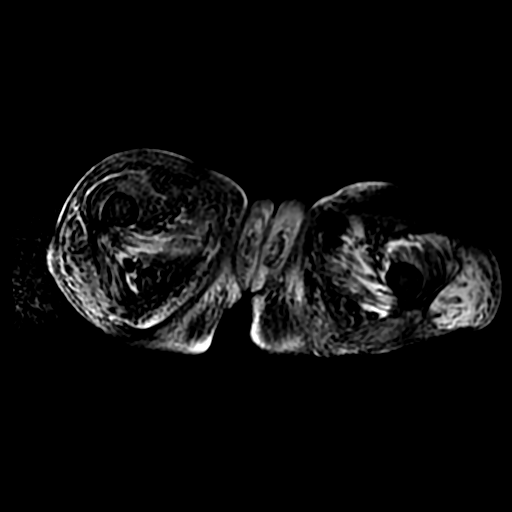
[im 6/38]
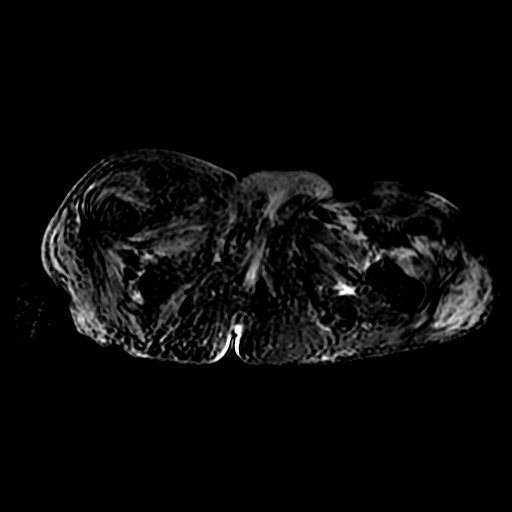
[im 11/38]
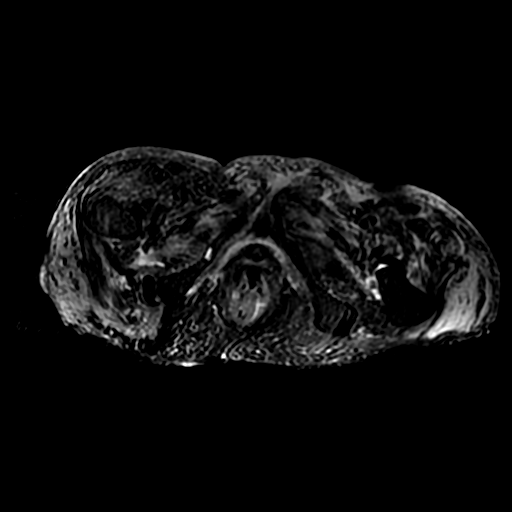
[im 16/38]
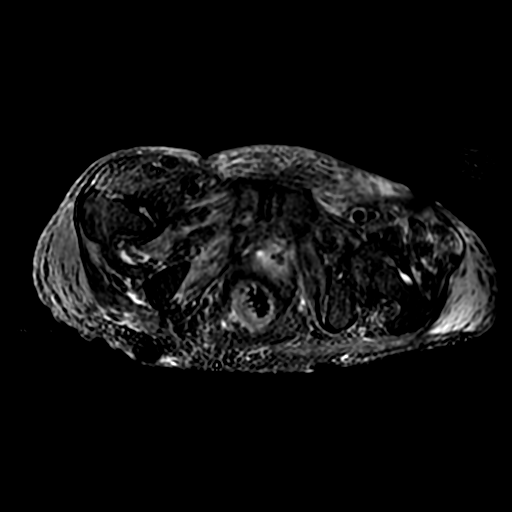
[im 22/38]
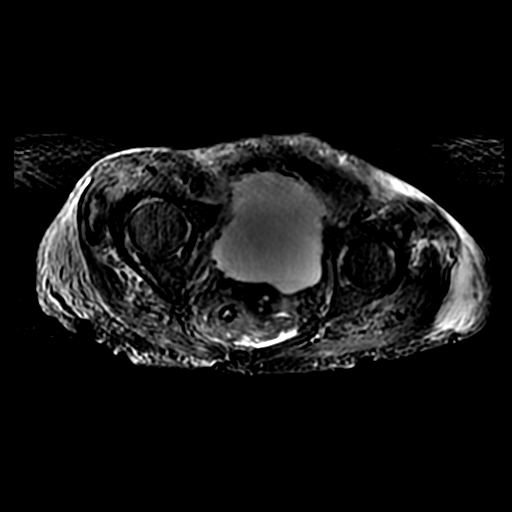
[im 32/38]
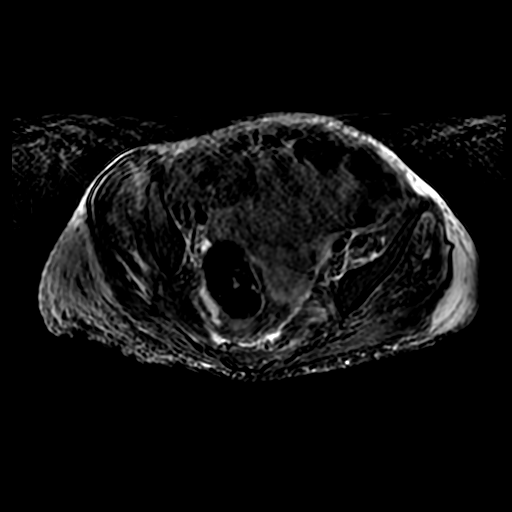

[Series 8: T1 · axial · 3.5mm · 0.70mm/px · z∈[+29,+133]mm · 3 of 38 slices shown (2 of 2)]
[im 6/38]
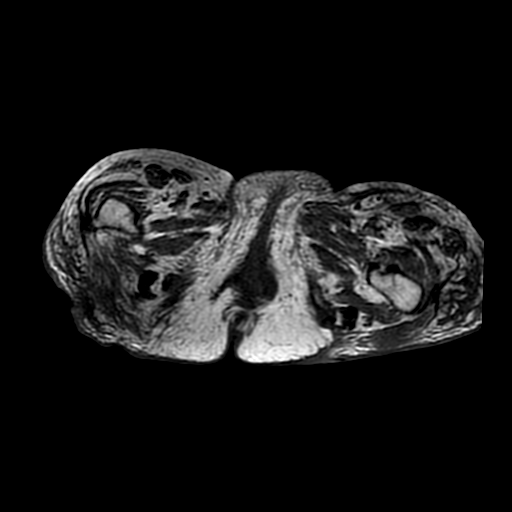
[im 22/38]
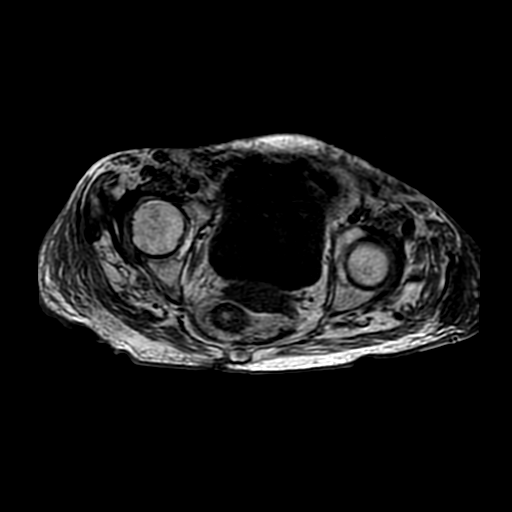
[im 32/38]
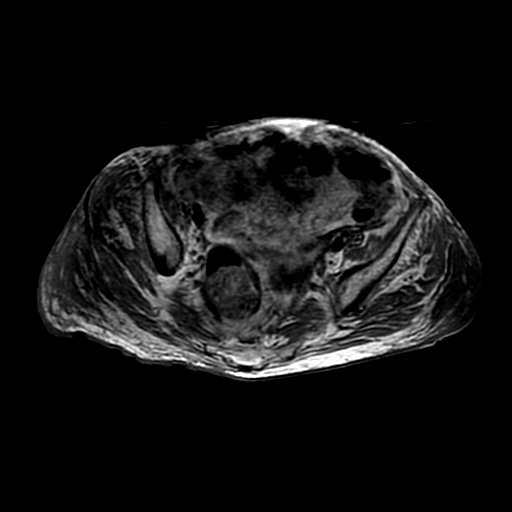

[Series 9: PD fat-sat · sagittal · 4.0mm · 0.35mm/px · 7 of 35 slices shown]
[im 1/35]
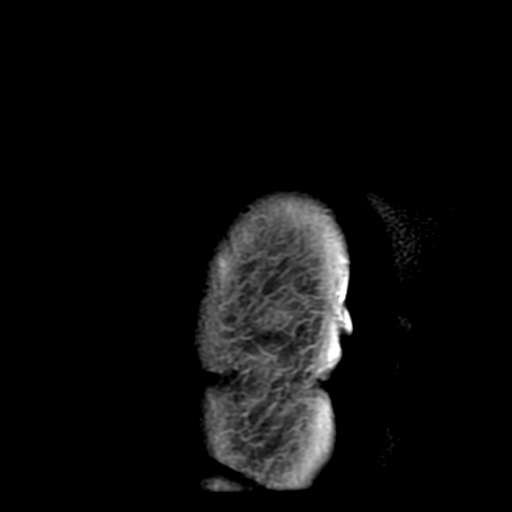
[im 6/35]
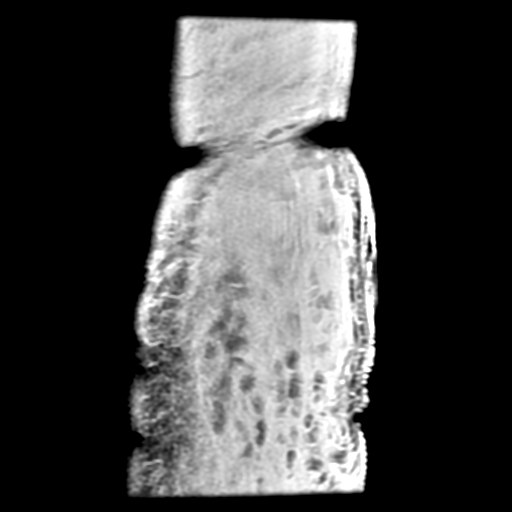
[im 12/35]
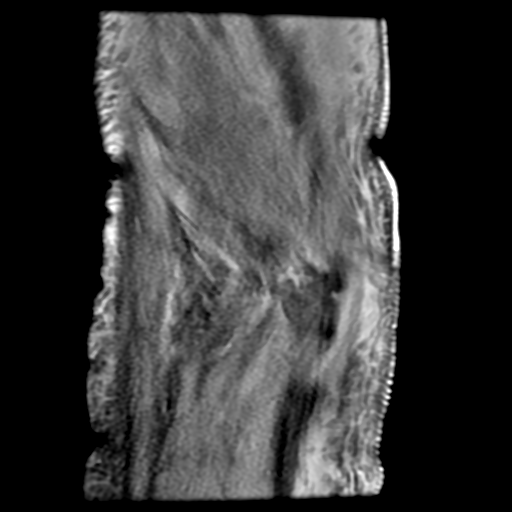
[im 18/35]
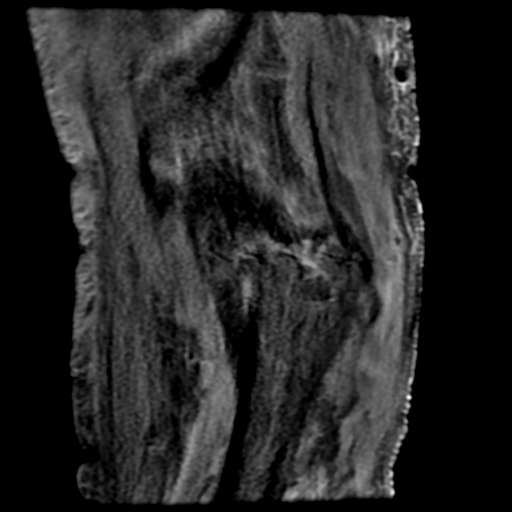
[im 23/35]
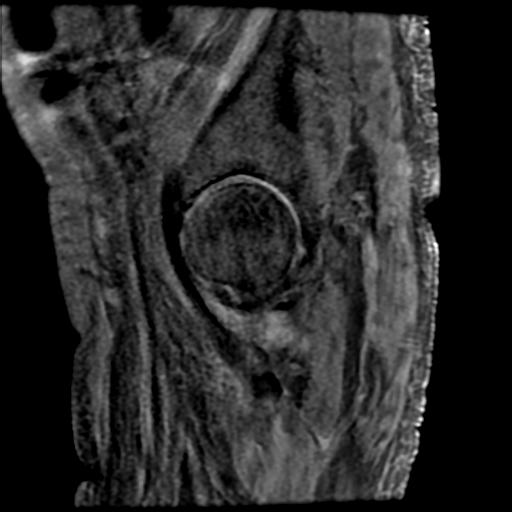
[im 29/35]
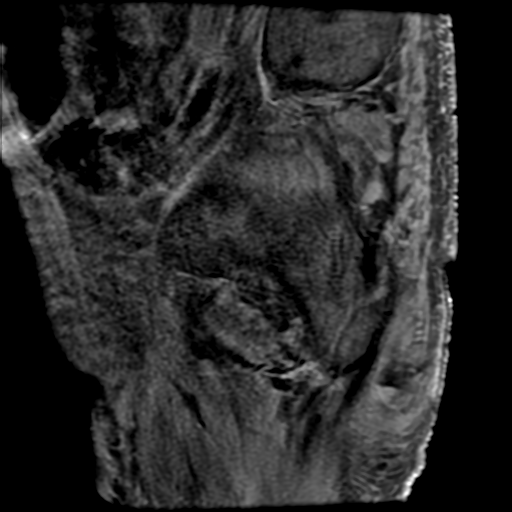
[im 35/35]
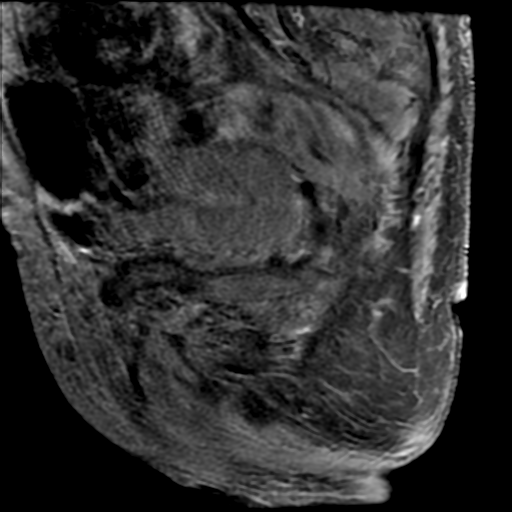

[19 of 40 positions shown; findings below may reference images not displayed]

FINDINGS: Bones: There is marrow edema in the right and left sacrum due to
bilateral sacral fractures. The patient also has a right inferior
pubic ramus fracture and right pubic bone fracture. No other
fracture is identified. Both hips are located. There is no avascular
necrosis of the femoral heads.

Articular cartilage and labrum

Articular cartilage: Evaluation is somewhat limited by patient
motion but only mild degenerative change is seen.

Labrum:  Appears intact.

Joint or bursal effusion

Joint effusion:  None.

Bursae:  Negative.

Muscles and tendons

Muscles and tendons: Increased T2 signal is seen in all imaged
musculature. No muscle or tendon tear is identified.

Other findings

Miscellaneous: Diffuse subcutaneous edema is present. There is a
small volume of free pelvic fluid.
IMPRESSION: Findings consistent with acute or subacute bilateral sacral
fractures and right inferior pubic ramus and right pubic bone
fractures.

Negative for hip fracture.

Marked body wall edema with a small volume of free pelvic fluid and
intramuscular edema likely due to volume overload.
